# Patient Record
Sex: Female | Born: 1985
Health system: Southern US, Community
[De-identification: ages and names within clinical notes are randomized; demographics above are authoritative.]

## PROBLEM LIST (undated history)

## (undated) DIAGNOSIS — R569 Unspecified convulsions: Secondary | ICD-10-CM

## (undated) DIAGNOSIS — Z789 Other specified health status: Secondary | ICD-10-CM

## (undated) DIAGNOSIS — Z8619 Personal history of other infectious and parasitic diseases: Secondary | ICD-10-CM

## (undated) HISTORY — DX: Other specified health status: Z78.9

## (undated) HISTORY — DX: Unspecified convulsions: R56.9

## (undated) HISTORY — PX: TONSILLECTOMY: SUR1361

## (undated) HISTORY — DX: Personal history of other infectious and parasitic diseases: Z86.19

---

## 2019-12-04 ENCOUNTER — Encounter (HOSPITAL_COMMUNITY): Payer: Self-pay

## 2019-12-04 ENCOUNTER — Encounter (HOSPITAL_COMMUNITY): Payer: Self-pay | Admitting: Obstetrics and Gynecology

## 2019-12-04 ENCOUNTER — Inpatient Hospital Stay (HOSPITAL_COMMUNITY)
Admission: AD | Admit: 2019-12-04 | Discharge: 2019-12-04 | Disposition: A | Discharge status: Home / Self Care | Payer: Medicaid Other | Attending: Obstetrics and Gynecology | Admitting: Obstetrics and Gynecology

## 2019-12-04 ENCOUNTER — Inpatient Hospital Stay (HOSPITAL_BASED_OUTPATIENT_CLINIC_OR_DEPARTMENT_OTHER): Payer: Medicaid Other

## 2019-12-04 DIAGNOSIS — R109 Unspecified abdominal pain: Secondary | ICD-10-CM | POA: Diagnosis not present

## 2019-12-04 DIAGNOSIS — O0932 Supervision of pregnancy with insufficient antenatal care, second trimester: Secondary | ICD-10-CM

## 2019-12-04 DIAGNOSIS — O99332 Smoking (tobacco) complicating pregnancy, second trimester: Secondary | ICD-10-CM | POA: Insufficient documentation

## 2019-12-04 DIAGNOSIS — Z3A26 26 weeks gestation of pregnancy: Secondary | ICD-10-CM

## 2019-12-04 DIAGNOSIS — O4400 Placenta previa specified as without hemorrhage, unspecified trimester: Secondary | ICD-10-CM | POA: Insufficient documentation

## 2019-12-04 DIAGNOSIS — O26892 Other specified pregnancy related conditions, second trimester: Secondary | ICD-10-CM | POA: Diagnosis not present

## 2019-12-04 DIAGNOSIS — O4402 Placenta previa specified as without hemorrhage, second trimester: Secondary | ICD-10-CM

## 2019-12-04 DIAGNOSIS — F17210 Nicotine dependence, cigarettes, uncomplicated: Secondary | ICD-10-CM | POA: Insufficient documentation

## 2019-12-04 DIAGNOSIS — O26899 Other specified pregnancy related conditions, unspecified trimester: Secondary | ICD-10-CM

## 2019-12-04 DIAGNOSIS — O4440 Low lying placenta NOS or without hemorrhage, unspecified trimester: Secondary | ICD-10-CM | POA: Insufficient documentation

## 2019-12-04 DIAGNOSIS — R1030 Lower abdominal pain, unspecified: Secondary | ICD-10-CM | POA: Insufficient documentation

## 2019-12-04 DIAGNOSIS — O0930 Supervision of pregnancy with insufficient antenatal care, unspecified trimester: Secondary | ICD-10-CM

## 2019-12-04 LAB — CBC
HCT: 33.4 % — ABNORMAL LOW (ref 36.0–46.0)
Hemoglobin: 10.9 g/dL — ABNORMAL LOW (ref 12.0–15.0)
MCH: 26.9 pg (ref 26.0–34.0)
MCHC: 32.6 g/dL (ref 30.0–36.0)
MCV: 82.5 fL (ref 80.0–100.0)
Platelets: 253 10*3/uL (ref 150–400)
RBC: 4.05 MIL/uL (ref 3.87–5.11)
RDW: 14.8 % (ref 11.5–15.5)
WBC: 8.4 10*3/uL (ref 4.0–10.5)
nRBC: 0 % (ref 0.0–0.2)

## 2019-12-04 LAB — URINALYSIS, ROUTINE W REFLEX MICROSCOPIC
Bilirubin Urine: NEGATIVE
Glucose, UA: NEGATIVE mg/dL
Hgb urine dipstick: NEGATIVE
Ketones, ur: NEGATIVE mg/dL
Nitrite: NEGATIVE
Protein, ur: 30 mg/dL — AB
Specific Gravity, Urine: 1.026 (ref 1.005–1.030)
pH: 6 (ref 5.0–8.0)

## 2019-12-04 LAB — WET PREP, GENITAL
Sperm: NONE SEEN
Trich, Wet Prep: NONE SEEN
Yeast Wet Prep HPF POC: NONE SEEN

## 2019-12-04 NOTE — MAU Note (Signed)
.   Lynn Duncan is a 34 y.o. at [redacted]w[redacted]d here in MAU reporting: lower abdominal cramping since last night that has gotten worse throughout the day. PT states that she had one visit at 15 weeks in [place], and has been to the hospital for different complaints and was told at 23 weeks that she had a previa and needed to seek care. PT denies any VB or LOF. States she is homeless and is now living at the room at the end. LMP: 05/12/19 Onset of complaint: last night Pain score:8  Vitals:   12/04/19 1632  BP: 120/70  Pulse: 97  Resp: 18  Temp: 98.1 F (36.7 C)     FHT:142 Lab orders placed from triage: UA
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
## 2019-12-04 NOTE — Discharge Instructions (Signed)
Abdominal Pain During Pregnancy  Belly (abdominal) pain is common during pregnancy. There are many possible causes. Most of the time, it is not a serious problem. Other times, it can be a sign that something is wrong with the pregnancy. Always tell your doctor if you have belly pain. Follow these instructions at home:  Do not have sex or put anything in your vagina until your pain goes away completely.  Get plenty of rest until your pain gets better.  Drink enough fluid to keep your pee (urine) pale yellow.  Take over-the-counter and prescription medicines only as told by your doctor.  Keep all follow-up visits as told by your doctor. This is important. Contact a doctor if:  Your pain continues or gets worse after resting.  You have lower belly pain that: ? Comes and goes at regular times. ? Spreads to your back. ? Feels like menstrual cramps.  You have pain or burning when you pee (urinate). Get help right away if:  You have a fever or chills.  You have vaginal bleeding.  You are leaking fluid from your vagina.  You are passing tissue from your vagina.  You throw up (vomit) for more than 24 hours.  You have watery poop (diarrhea) for more than 24 hours.  Your baby is moving less than usual.  You feel very weak or faint.  You have shortness of breath.  You have very bad pain in your upper belly. Summary  Belly (abdominal) pain is common during pregnancy. There are many possible causes.  If you have belly pain during pregnancy, tell your doctor right away.  Keep all follow-up visits as told by your doctor. This is important. This information is not intended to replace advice given to you by your health care provider. Make sure you discuss any questions you have with your health care provider. Document Revised: 12/15/2018 Document Reviewed: 11/29/2016 Elsevier Patient Education  2020 [employer]  

## 2019-12-07 ENCOUNTER — Telehealth: Payer: Self-pay | Admitting: Family Medicine

## 2019-12-07 ENCOUNTER — Encounter (HOSPITAL_COMMUNITY): Payer: Self-pay | Admitting: Obstetrics and Gynecology

## 2019-12-07 LAB — GC/CHLAMYDIA PROBE AMP (~~LOC~~) NOT AT ARMC
Chlamydia: NEGATIVE
Comment: NEGATIVE
Comment: NORMAL
Neisseria Gonorrhea: NEGATIVE

## 2019-12-07 NOTE — Telephone Encounter (Signed)
Called the patient to inform of the upcoming visit. I was placed on hold for the patient. A woman then came back to the line and stated the patient will have to call our office on the house phone because the number we called is the business phone and she cant come to the phone right now. An appointment reminder will be mailed.

## 2020-01-04 ENCOUNTER — Ambulatory Visit (INDEPENDENT_AMBULATORY_CARE_PROVIDER_SITE_OTHER): Payer: Medicaid Other | Admitting: *Deleted

## 2020-01-04 ENCOUNTER — Other Ambulatory Visit: Payer: Self-pay

## 2020-01-04 DIAGNOSIS — O4400 Placenta previa specified as without hemorrhage, unspecified trimester: Secondary | ICD-10-CM

## 2020-01-04 DIAGNOSIS — O0990 Supervision of high risk pregnancy, unspecified, unspecified trimester: Secondary | ICD-10-CM

## 2020-01-04 NOTE — Progress Notes (Signed)
0823 I called home number on file for telephone visit today for new ob intake. Was told by a female who answered, she is no longer a resident there and she does not have a number to reach [place]. No mobile or work number available.  I called her contact number for Pamala Hurry and left a message I am calling for [facility] and you are listed as a contact. Please give her the message to call us regarding her appointment.  Craig Ionescu,RN

## 2020-01-06 ENCOUNTER — Encounter: Payer: Self-pay | Admitting: Family Medicine

## 2020-01-06 ENCOUNTER — Encounter: Payer: Medicaid Other | Admitting: Family Medicine

## 2020-01-06 NOTE — Progress Notes (Signed)
Patient did not keep appointment today. She will be called to reschedule.  

## 2020-01-14 ENCOUNTER — Ambulatory Visit (INDEPENDENT_AMBULATORY_CARE_PROVIDER_SITE_OTHER): Payer: Medicaid Other | Admitting: Obstetrics and Gynecology

## 2020-01-14 ENCOUNTER — Encounter: Payer: Self-pay | Admitting: Obstetrics and Gynecology

## 2020-01-14 ENCOUNTER — Other Ambulatory Visit: Payer: Self-pay

## 2020-01-14 VITALS — BP 114/76 | HR 85 | Wt 232.5 lb

## 2020-01-14 DIAGNOSIS — O0933 Supervision of pregnancy with insufficient antenatal care, third trimester: Secondary | ICD-10-CM

## 2020-01-14 DIAGNOSIS — O0990 Supervision of high risk pregnancy, unspecified, unspecified trimester: Secondary | ICD-10-CM

## 2020-01-14 DIAGNOSIS — O0930 Supervision of pregnancy with insufficient antenatal care, unspecified trimester: Secondary | ICD-10-CM | POA: Insufficient documentation

## 2020-01-14 DIAGNOSIS — Z3A32 32 weeks gestation of pregnancy: Secondary | ICD-10-CM

## 2020-01-14 DIAGNOSIS — O4403 Placenta previa specified as without hemorrhage, third trimester: Secondary | ICD-10-CM

## 2020-01-14 NOTE — Progress Notes (Signed)
New OB Note  01/14/2020   Clinic: [facility]  Chief Complaint: NOB  Transfer of Care Patient: no  History of Present Illness: Ms. Groen is a 34 y.o. G1P0 @ 32/5 weeks ([place] 6/26, based on Patient's last menstrual period was 05/30/2019.=27wk u/s.  Preg complicated by has Placenta previa found during pregnancy without hemorrhage; Supervision of high risk pregnancy, antepartum; and Late prenatal care on their problem list.   Patient on no medications, BC when she got pregnant No VB or spotting  ROS: A 12-point review of systems was performed and negative, except as stated in the above HPI.  OBGYN History: As per HPI. OB History  Gravida Para Term Preterm AB Living  1            SAB TAB Ectopic Multiple Live Births               # Outcome Date GA Lbr Len/2nd Weight Sex Delivery Anes PTL Lv  1 Current             Any issues with any prior pregnancies: not applicable Prior children are healthy, doing well, and without any problems or issues: not applicable History of pap smears: unknown.   Past Medical History: Past Medical History:  Diagnosis Date  . Medical history non-contributory     Past Surgical History: Past Surgical History:  Procedure Laterality Date  . TONSILLECTOMY      Family History:  History reviewed. No pertinent family history.  She denies any history of mental retardation, birth defects or genetic disorders in her or the FOB's history  Social History:  Social History   Socioeconomic History  . Marital status: Single    Spouse name: Not on file  . Number of children: Not on file  . Years of education: Not on file  . Highest education level: Not on file  Occupational History  . Not on file  Tobacco Use  . Smoking status: Light Tobacco Smoker  . Smokeless tobacco: Current User  . Tobacco comment: 3 cigs this month  Substance and Sexual Activity  . Alcohol use: Never  . Drug use: Never  . Sexual activity: Yes     Birth control/protection: None  Other Topics Concern  . Not on file  Social History Narrative  . Not on file   Social Determinants of Health   Financial Resource Strain:   . Difficulty of Paying Living Expenses:   Food Insecurity: No Food Insecurity  . Worried About Charity fundraiser in the Last Year: Never true  . Ran Out of Food in the Last Year: Never true  Transportation Needs: No Transportation Needs  . Lack of Transportation (Medical): No  . Lack of Transportation (Non-Medical): No  Physical Activity:   . Days of Exercise per Week:   . Minutes of Exercise per Session:   Stress:   . Feeling of Stress :   Social Connections:   . Frequency of Communication with Friends and Family:   . Frequency of Social Gatherings with Friends and Family:   . Attends Religious Services:   . Active Member of Clubs or Organizations:   . Attends Archivist Meetings:   Marland Kitchen Marital Status:   Intimate Partner Violence:   . Fear of Current or Ex-Partner:   . Emotionally Abused:   Marland Kitchen Physically Abused:   . Sexually Abused:      Allergy: No Known Allergies  Health Maintenance:  Mammogram Up to Date: not applicable  Current Outpatient Medications: PNV @CMEDTAKING @  Physical Exam:   BP 114/76   Pulse 85   Wt 232 lb 8 oz (105.5 kg)   LMP 05/30/2019   BMI 39.91 kg/m  Body mass index is 39.91 kg/m. Contractions: Not present Vag. Bleeding: None. Fundal height: 31 FHTs: 140s  General appearance: Well nourished, well developed female in no acute distress.  Neck:  Supple, normal appearance, and no thyromegaly  Cardiovascular: S1, S2 normal, no murmur, rub or gallop, regular rate and rhythm Respiratory:  Clear to auscultation bilateral. Normal respiratory effort Abdomen: positive bowel sounds and no masses, hernias; diffusely non tender to palpation, non distended Breasts: no breast s/s Neuro/Psych:  Normal mood and affect.  Skin:  Warm and dry.    Laboratory: reviewed  Imaging:  reviewed  Assessment: pt stable  Plan: 1. Supervision of high risk pregnancy, antepartum Routine care.  - Culture, OB Urine - Obstetric Panel, Including HIV - Glucose Tolerance, 2 Hours w/1 Hour - Hepatitis C Antibody - CMP and Liver - Protein / creatinine ratio, urine - [id number]+Oxyco+Alc+Crt-Bund - Hemoglobin A1c - TSH - [place] MFM OB DETAIL +14 WK; Future - [place] MFM OB Transvaginal; Future  2. Late prenatal care  3. Previa Precautions given. Rpt u/s ordered.   Problem list reviewed and updated.  Follow up in 2 weeks.   >50% of 25 min visit spent on counseling and coordination of care.     Durene Romans MD Attending Center for [facility])

## 2020-01-15 ENCOUNTER — Inpatient Hospital Stay (HOSPITAL_COMMUNITY)
Admission: AD | Admit: 2020-01-15 | Discharge: 2020-01-15 | Disposition: A | Discharge status: Home / Self Care | Payer: Medicaid Other | Attending: Obstetrics and Gynecology | Admitting: Obstetrics and Gynecology

## 2020-01-15 ENCOUNTER — Telehealth: Payer: Self-pay | Admitting: Family Medicine

## 2020-01-15 ENCOUNTER — Inpatient Hospital Stay (HOSPITAL_BASED_OUTPATIENT_CLINIC_OR_DEPARTMENT_OTHER): Payer: Medicaid Other

## 2020-01-15 ENCOUNTER — Other Ambulatory Visit: Payer: Self-pay

## 2020-01-15 ENCOUNTER — Encounter (HOSPITAL_COMMUNITY): Payer: Self-pay | Admitting: Obstetrics and Gynecology

## 2020-01-15 DIAGNOSIS — O26893 Other specified pregnancy related conditions, third trimester: Secondary | ICD-10-CM | POA: Diagnosis present

## 2020-01-15 DIAGNOSIS — O368330 Maternal care for abnormalities of the fetal heart rate or rhythm, third trimester, not applicable or unspecified: Secondary | ICD-10-CM

## 2020-01-15 DIAGNOSIS — O288 Other abnormal findings on antenatal screening of mother: Secondary | ICD-10-CM

## 2020-01-15 DIAGNOSIS — O98313 Other infections with a predominantly sexual mode of transmission complicating pregnancy, third trimester: Secondary | ICD-10-CM | POA: Diagnosis not present

## 2020-01-15 DIAGNOSIS — F17210 Nicotine dependence, cigarettes, uncomplicated: Secondary | ICD-10-CM | POA: Diagnosis not present

## 2020-01-15 DIAGNOSIS — O99323 Drug use complicating pregnancy, third trimester: Secondary | ICD-10-CM | POA: Diagnosis not present

## 2020-01-15 DIAGNOSIS — R102 Pelvic and perineal pain: Secondary | ICD-10-CM | POA: Insufficient documentation

## 2020-01-15 DIAGNOSIS — Z3A32 32 weeks gestation of pregnancy: Secondary | ICD-10-CM | POA: Diagnosis not present

## 2020-01-15 DIAGNOSIS — O26899 Other specified pregnancy related conditions, unspecified trimester: Secondary | ICD-10-CM

## 2020-01-15 DIAGNOSIS — K529 Noninfective gastroenteritis and colitis, unspecified: Secondary | ICD-10-CM | POA: Diagnosis not present

## 2020-01-15 DIAGNOSIS — O218 Other vomiting complicating pregnancy: Secondary | ICD-10-CM | POA: Diagnosis not present

## 2020-01-15 DIAGNOSIS — O99333 Smoking (tobacco) complicating pregnancy, third trimester: Secondary | ICD-10-CM | POA: Insufficient documentation

## 2020-01-15 DIAGNOSIS — O99613 Diseases of the digestive system complicating pregnancy, third trimester: Secondary | ICD-10-CM | POA: Diagnosis not present

## 2020-01-15 DIAGNOSIS — O4443 Low lying placenta NOS or without hemorrhage, third trimester: Secondary | ICD-10-CM

## 2020-01-15 DIAGNOSIS — R109 Unspecified abdominal pain: Secondary | ICD-10-CM

## 2020-01-15 DIAGNOSIS — O0990 Supervision of high risk pregnancy, unspecified, unspecified trimester: Secondary | ICD-10-CM

## 2020-01-15 DIAGNOSIS — R768 Other specified abnormal immunological findings in serum: Secondary | ICD-10-CM

## 2020-01-15 DIAGNOSIS — A599 Trichomoniasis, unspecified: Secondary | ICD-10-CM

## 2020-01-15 DIAGNOSIS — F1290 Cannabis use, unspecified, uncomplicated: Secondary | ICD-10-CM | POA: Insufficient documentation

## 2020-01-15 DIAGNOSIS — O0933 Supervision of pregnancy with insufficient antenatal care, third trimester: Secondary | ICD-10-CM

## 2020-01-15 DIAGNOSIS — O4440 Low lying placenta NOS or without hemorrhage, unspecified trimester: Secondary | ICD-10-CM

## 2020-01-15 LAB — RAPID URINE DRUG SCREEN, HOSP PERFORMED
Amphetamines: NOT DETECTED
Barbiturates: NOT DETECTED
Benzodiazepines: NOT DETECTED
Cocaine: NOT DETECTED
Opiates: NOT DETECTED
Tetrahydrocannabinol: POSITIVE — AB

## 2020-01-15 LAB — URINALYSIS, ROUTINE W REFLEX MICROSCOPIC
Bilirubin Urine: NEGATIVE
Glucose, UA: NEGATIVE mg/dL
Hgb urine dipstick: NEGATIVE
Ketones, ur: NEGATIVE mg/dL
Nitrite: NEGATIVE
Protein, ur: NEGATIVE mg/dL
Specific Gravity, Urine: 1.026 (ref 1.005–1.030)
pH: 6 (ref 5.0–8.0)

## 2020-01-15 LAB — PROTEIN / CREATININE RATIO, URINE
Creatinine, Urine: 171.5 mg/dL
Protein, Ur: 19 mg/dL
Protein/Creat Ratio: 111 mg/g creat (ref 0–200)

## 2020-01-15 LAB — WET PREP, GENITAL
Clue Cells Wet Prep HPF POC: NONE SEEN
Sperm: NONE SEEN
Yeast Wet Prep HPF POC: NONE SEEN

## 2020-01-15 LAB — TSH: TSH: 1.4 u[IU]/mL (ref 0.450–4.500)

## 2020-01-15 LAB — HEPATITIS C ANTIBODY: Hep C Virus Ab: >11 s/co ratio — ABNORMAL HIGH (ref 0.0–0.9)

## 2020-01-15 MED ORDER — FAMOTIDINE IN NACL 20-0.9 MG/50ML-% IV SOLN
20.0000 mg | Freq: Once | INTRAVENOUS | Status: AC
  Administered 2020-01-15: 20 mg via INTRAVENOUS
  Filled 2020-01-15: qty 50

## 2020-01-15 MED ORDER — LACTATED RINGERS IV BOLUS
1000.0000 mL | Freq: Once | INTRAVENOUS | Status: AC
  Administered 2020-01-15: 1000 mL via INTRAVENOUS

## 2020-01-15 MED ORDER — METRONIDAZOLE 500 MG PO TABS
2000.0000 mg | ORAL_TABLET | Freq: Once | ORAL | Status: AC
  Administered 2020-01-15: 2000 mg via ORAL
  Filled 2020-01-15: qty 4

## 2020-01-15 MED ORDER — SODIUM CHLORIDE 0.9 % IV SOLN
8.0000 mg | Freq: Once | INTRAVENOUS | Status: AC
  Administered 2020-01-15: 8 mg via INTRAVENOUS
  Filled 2020-01-15: qty 4

## 2020-01-15 NOTE — MAU Provider Note (Signed)
History     CSN: [id number]  Arrival date and time: 01/15/20 1101   First Provider Initiated Contact with Patient 01/15/20 1125      Chief Complaint  Patient presents with  . Rupture of Membranes   [name] is a 34 y.o. G1P0 at [redacted]w[redacted]d who presents to MAU for possible ROM around 4AM this morning. Patient reports she did not come in sooner because she "thought it was normal." Patient reports single gush of clear fluid during urination that was clear and odorless and ended up in the toilet. Patient denies any additional leaking of fluid since that time. Patient came by EMS and reports she decided to call the ambulance because of a "heavy pressure/stretching and like her vaginal area was being ripped apart" around 830AM this morning. Patient reports this same sensation came about 5 minutes later and also had it in the ambulance as well, but has not had it since the ambulance ride and reports the event in the ambulance ride was mild. Patient endorses only 3 episodes of this pain since this morning and denies this pain while in MAU.  Patient also reports nausea and vomiting that started around 830AM along with diarrhea. Pt denies sick contacts. Patient reports two episodes of diarrhea since that time, but endorses small pieces of urine present. Patient reports last BM 30 min prior to ambulance arrival. Patient reports she tried to drink ginger ale and had crackers but those did not stay down. Patient reports vomiting x2 since 0830AM. Patient denies issues with nausea and vomiting in the past. Patient endorses eating chicken alfredo that she made at home last night.  Pt denies VB, ctx, decreased FM, vaginal discharge/odor/itching. Pt denies constipation, or urinary problems. Pt denies fever, chills, fatigue, sweating or changes in appetite. Pt denies SOB or chest pain. Pt denies dizziness, HA, light-headedness, weakness.  Problems this pregnancy include: previa, complete. Allergies?  NKDA Current medications/supplements? PNVs Prenatal care provider? [facility], next appt 01/28/2020   OB History    Gravida  1   Para      Term      Preterm      AB      Living        SAB      TAB      Ectopic      Multiple      Live Births              Past Medical History:  Diagnosis Date  . Medical history non-contributory     Past Surgical History:  Procedure Laterality Date  . TONSILLECTOMY      History reviewed. No pertinent family history.  Social History   Tobacco Use  . Smoking status: Light Tobacco Smoker  . Smokeless tobacco: Current User  . Tobacco comment: 3 cigs this month  Substance Use Topics  . Alcohol use: Never  . Drug use: Never    Allergies: No Known Allergies  Medications Prior to Admission  Medication Sig Dispense Refill Last Dose  . acetaminophen (TYLENOL) 325 MG tablet Take 650 mg by mouth every 6 (six) hours as needed for mild pain or headache.   Past Week at Unknown time    Review of Systems  Constitutional: Negative for chills, diaphoresis, fatigue and fever.  Eyes: Negative for visual disturbance.  Respiratory: Negative for shortness of breath.   Cardiovascular: Negative for chest pain.  Gastrointestinal: Positive for abdominal pain, diarrhea, nausea and vomiting. Negative for constipation.  Genitourinary: Positive  for pelvic pain. Negative for dysuria, flank pain, frequency, urgency, vaginal bleeding and vaginal discharge.  Neurological: Negative for dizziness, weakness, light-headedness and headaches.   Physical Exam   Blood pressure (!) 96/43, pulse 69, temperature 97.9 F (36.6 C), temperature source Oral, resp. rate 16, last menstrual period 05/30/2019, SpO2 99 %, unknown if currently breastfeeding.  Patient Vitals for the past 24 hrs:  BP Temp Temp src Pulse Resp SpO2  01/15/20 1529 (!) 96/43 -- -- 69 -- --  01/15/20 1140 -- -- -- -- -- 99 %  01/15/20 1117 98/63 -- -- 92 -- --  01/15/20 1111 -- 97.9 F  (36.6 C) Oral -- 16 --   Physical Exam  Constitutional: She is oriented to person, place, and time. She appears well-developed and well-nourished. No distress.  HENT:  Head: Normocephalic and atraumatic.  Respiratory: Effort normal.  GI: Soft. She exhibits no distension and no mass. There is no abdominal tenderness. There is no rebound and no guarding.  Neurological: She is alert and oriented to person, place, and time.  Skin: Skin is warm and dry. She is not diaphoretic.  Psychiatric: She has a normal mood and affect. Her behavior is normal. Judgment and thought content normal.   Results for orders placed or performed during the hospital encounter of 01/15/20 (from the past 24 hour(s))  Urinalysis, Routine w reflex microscopic     Status: Abnormal   Collection Time: 01/15/20 11:27 AM  Result Value Ref Range   Color, Urine YELLOW YELLOW   APPearance HAZY (A) CLEAR   Specific Gravity, Urine 1.026 1.005 - 1.030   pH 6.0 5.0 - 8.0   Glucose, UA NEGATIVE NEGATIVE mg/dL   Hgb urine dipstick NEGATIVE NEGATIVE   Bilirubin Urine NEGATIVE NEGATIVE   Ketones, ur NEGATIVE NEGATIVE mg/dL   Protein, ur NEGATIVE NEGATIVE mg/dL   Nitrite NEGATIVE NEGATIVE   Leukocytes,Ua MODERATE (A) NEGATIVE   RBC / HPF 6-10 0 - 5 RBC/hpf   WBC, UA 6-10 0 - 5 WBC/hpf   Bacteria, UA FEW (A) NONE SEEN   Squamous Epithelial / LPF 6-10 0 - 5   Mucus PRESENT    Trichomonas, UA PRESENT (A) NONE SEEN  Urine rapid drug screen (hosp performed)     Status: Abnormal   Collection Time: 01/15/20 11:36 AM  Result Value Ref Range   Opiates NONE DETECTED NONE DETECTED   Cocaine NONE DETECTED NONE DETECTED   Benzodiazepines NONE DETECTED NONE DETECTED   Amphetamines NONE DETECTED NONE DETECTED   Tetrahydrocannabinol POSITIVE (A) NONE DETECTED   Barbiturates NONE DETECTED NONE DETECTED  Wet prep, genital     Status: Abnormal   Collection Time: 01/15/20  3:18 PM  Result Value Ref Range   Yeast Wet Prep HPF POC NONE SEEN  NONE SEEN   Trich, Wet Prep PRESENT (A) NONE SEEN   Clue Cells Wet Prep HPF POC NONE SEEN NONE SEEN   WBC, Wet Prep HPF POC MANY (A) NONE SEEN   Sperm NONE SEEN    No results found.  MAU Course  Procedures  MDM -N/V/diarrhea since this morning, without continued episodes in MAU -UA: hazy/mod leuks/few bacteria/+trich, sending urine for culture -UDS: +THC -1L LR + 20mg  Pepcid + 8mg  Zofran given, pt reports N/V now resolved -pt able to urinate after fluids -PO challenge successful  -no concern for ROM with single episode of fluid simultaneously with urination without additional episode of leakage and normal fluid on [place] -no abdominal or pelvic pain present  while in MAU for the entire duration of visit -+HCV antibodies, blood drawn for confirmatory testing while in MAU, pt reports she has been told she was positive for antibodies in the past, but that it was "nothing to worry about" -aptima not performed in office yesterday at [facility], performed WetPrep/GC/CT today in light of +trich in urine -WetPrep: +trich -metronidazole 2g given in MAU  -EFM: non-reactive       -baseline: 140/130       -variability: moderate       -accels: present, 10x10       -decels: absent       -TOCO: few isolated ctx, pt not feeling, not present on majority of strip, cervical exam not performed -US: previa resolved, now low lying, AFI 19.6cm, CL 4.2cm, ~9cm fibroid -BPP: 8/8 -consulted with Dr. Rip Harbour in light of non-reactive NST, per Dr. Rip Harbour, pt OK to be discharged home with non-reactive NST and BPP 8/8 and to follow previously establish office/US appt schedule -pt discharged to home in stable condition  Orders Placed This Encounter  Procedures  . Culture, OB Urine    Standing Status:   Standing    Number of Occurrences:   1  . Wet prep, genital    Standing Status:   Standing    Number of Occurrences:   1  . [place] MFM FETAL BPP WO NON STRESS    Standing Status:   Standing    Number of Occurrences:   1     Order Specific Question:   Symptom/Reason for Exam    Answer:   Non-reactive NST (non-stress test) [id number]  . [place] MFM OB Transvaginal    Patient has known previa.    Standing Status:   Standing    Number of Occurrences:   1    Order Specific Question:   Symptom/Reason for Exam    Answer:   Abdominal pain in pregnancy [id number]  . Urinalysis, Routine w reflex microscopic    Standing Status:   Standing    Number of Occurrences:   1  . Urine rapid drug screen (hosp performed)    Standing Status:   Standing    Number of Occurrences:   1  . Hepatitis c vrs RNA detect by PCR-qual    Standing Status:   Standing    Number of Occurrences:   1  . Insert peripheral IV    Standing Status:   Standing    Number of Occurrences:   1  . Discharge patient    Order Specific Question:   Discharge disposition    Answer:   01-Home or Self Care [1]    Order Specific Question:   Discharge patient date    Answer:   01/15/2020   Orders Placed This Encounter  Procedures  . Culture, OB Urine    Standing Status:   Standing    Number of Occurrences:   1  . Wet prep, genital    Standing Status:   Standing    Number of Occurrences:   1  . [place] MFM FETAL BPP WO NON STRESS    Standing Status:   Standing    Number of Occurrences:   1    Order Specific Question:   Symptom/Reason for Exam    Answer:   Non-reactive NST (non-stress test) [id number]  . [place] MFM OB Transvaginal    Patient has known previa.    Standing Status:   Standing    Number of Occurrences:   1  Order Specific Question:   Symptom/Reason for Exam    Answer:   Abdominal pain in pregnancy [id number]  . Urinalysis, Routine w reflex microscopic    Standing Status:   Standing    Number of Occurrences:   1  . Urine rapid drug screen (hosp performed)    Standing Status:   Standing    Number of Occurrences:   1  . Hepatitis c vrs RNA detect by PCR-qual    Standing Status:   Standing    Number of Occurrences:   1  . Insert peripheral IV     Standing Status:   Standing    Number of Occurrences:   1  . Discharge patient    Order Specific Question:   Discharge disposition    Answer:   01-Home or Self Care [1]    Order Specific Question:   Discharge patient date    Answer:   01/15/2020    Assessment and Plan   1. Gastroenteritis   2. Supervision of high risk pregnancy, antepartum   3. Abdominal pain in pregnancy   4. Non-reactive NST (non-stress test)   5. Trichomoniasis   6. Low-lying placenta   7. Hepatitis C antibody test positive   8. Abdominal pain during pregnancy in third trimester   9. Pelvic pain in pregnancy   10. Marijuana use    Allergies as of 01/15/2020   No Known Allergies     Medication List    TAKE these medications   acetaminophen 325 MG tablet Commonly known as: TYLENOL Take 650 mg by mouth every 6 (six) hours as needed for mild pain or headache.       -will call with culture results, if positive -RX expedited partner therapy for trich -EPT info given -low-lying placenta resolved -pt advised not to restart smoking marijuana for N/V or other reason as this can worsen symptoms of N/V -discussed s/sx of PTL/ROM -return MAU precautions given -pt discharged to home in stable condition   Elmyra Ricks E Raymonda Pell 01/15/2020, 3:43 PM

## 2020-01-15 NOTE — MAU Note (Signed)
.  Lynn Duncan is a 34 y.o. at [redacted]w[redacted]d here in MAU reporting: Patient states that she had a big gush of clear odorless fluid at 0400 this morning. She states that the fluid is no longer coming out. She also states that she has pelvic pressure. Yesterday at her first prenatal visit she was dx with a complete placenta previa. No VB. Endorses good fetal movement. Ctx are x10 minutes apart.  Pain score: 8  FHT:[id number]

## 2020-01-15 NOTE — Discharge Instructions (Signed)
Abdominal Pain During Pregnancy  Abdominal pain is common during pregnancy, and has many possible causes. Some causes are more serious than others, and sometimes the cause is not known. Abdominal pain can be a sign that labor is starting. It can also be caused by normal growth and stretching of muscles and ligaments during pregnancy. Always tell your health care provider if you have any abdominal pain. Follow these instructions at home:  Do not have sex or put anything in your vagina until your pain goes away completely.  Get plenty of rest until your pain improves.  Drink enough fluid to keep your urine pale yellow.  Take over-the-counter and prescription medicines only as told by your health care provider.  Keep all follow-up visits as told by your health care provider. This is important. Contact a health care provider if:  Your pain continues or gets worse after resting.  You have lower abdominal pain that: ? Comes and goes at regular intervals. ? Spreads to your back. ? Is similar to menstrual cramps.  You have pain or burning when you urinate. Get help right away if:  You have a fever or chills.  You have vaginal bleeding.  You are leaking fluid from your vagina.  You are passing tissue from your vagina.  You have vomiting or diarrhea that lasts for more than 24 hours.  Your baby is moving less than usual.  You feel very weak or faint.  You have shortness of breath.  You develop severe pain in your upper abdomen. Summary  Abdominal pain is common during pregnancy, and has many possible causes.  If you experience abdominal pain during pregnancy, tell your health care provider right away.  Follow your health care provider's home care instructions and keep all follow-up visits as directed. This information is not intended to replace advice given to you by your health care provider. Make sure you discuss any questions you have with your health care  provider. Document Revised: 12/15/2018 Document Reviewed: 11/29/2016 Elsevier Patient Education  2020 [place].      Fetal Movement Counts Patient Name: ________________________________________________ Patient Due Date: ____________________ What is a fetal movement count?  A fetal movement count is the number of times that you feel your baby move during a certain amount of time. This may also be called a fetal kick count. A fetal movement count is recommended for every pregnant woman. You may be asked to start counting fetal movements as early as week 28 of your pregnancy. Pay attention to when your baby is most active. You may notice your baby's sleep and wake cycles. You may also notice things that make your baby move more. You should do a fetal movement count:  When your baby is normally most active.  At the same time each day. A good time to count movements is while you are resting, after having something to eat and drink. How do I count fetal movements? 1. Find a quiet, comfortable area. Sit, or lie down on your side. 2. Write down the date, the start time and stop time, and the number of movements that you felt between those two times. Take this information with you to your health care visits. 3. Write down your start time when you feel the first movement. 4. Count kicks, flutters, swishes, rolls, and jabs. You should feel at least 10 movements. 5. You may stop counting after you have felt 10 movements, or if you have been counting for 2 hours. Write down the  stop time. 6. If you do not feel 10 movements in 2 hours, contact your health care provider for further instructions. Your health care provider may want to do additional tests to assess your baby's well-being. Contact a health care provider if:  You feel fewer than 10 movements in 2 hours.  Your baby is not moving like he or she usually does. Date: ____________ Start time: ____________ Stop time: ____________ Movements:  ____________ Date: ____________ Start time: ____________ Stop time: ____________ Movements: ____________ Date: ____________ Start time: ____________ Stop time: ____________ Movements: ____________ Date: ____________ Start time: ____________ Stop time: ____________ Movements: ____________ Date: ____________ Start time: ____________ Stop time: ____________ Movements: ____________ Date: ____________ Start time: ____________ Stop time: ____________ Movements: ____________ Date: ____________ Start time: ____________ Stop time: ____________ Movements: ____________ Date: ____________ Start time: ____________ Stop time: ____________ Movements: ____________ Date: ____________ Start time: ____________ Stop time: ____________ Movements: ____________ This information is not intended to replace advice given to you by your health care provider. Make sure you discuss any questions you have with your health care provider. Document Revised: 04/16/2019 Document Reviewed: 04/16/2019 Elsevier Patient Education  [place].        Trichomoniasis Trichomoniasis is an STI (sexually transmitted infection) that can affect both women and men. In women, the outer area of the female genitalia (vulva) and the vagina are affected. In men, mainly the penis is affected, but the prostate and other reproductive organs can also be involved.  This condition can be treated with medicine. It often has no symptoms (is asymptomatic), especially in men. If not treated, trichomoniasis can last for months or years. What are the causes? This condition is caused by a parasite called Trichomonas vaginalis. Trichomoniasis most often spreads from person to person (is contagious) through sexual contact. What increases the risk? The following factors may make you more likely to develop this condition:  Having unprotected sex.  Having sex with a partner who has trichomoniasis.  Having multiple sexual partners.  Having had  previous trichomoniasis infections or other STIs. What are the signs or symptoms? In women, symptoms of trichomoniasis include:  Abnormal vaginal discharge that is clear, white, gray, or yellow-green and foamy and has an unusual "fishy" odor.  Itching and irritation of the vagina and vulva.  Burning or pain during urination or sex.  Redness and swelling of the genitals. In men, symptoms of trichomoniasis include:  Penile discharge that may be foamy or contain pus.  Pain in the penis. This may happen only when urinating.  Itching or irritation inside the penis.  Burning after urination or ejaculation. How is this diagnosed? In women, this condition may be found during a routine Pap test or physical exam. It may be found in men during a routine physical exam. Your health care provider may do tests to help diagnose this infection, such as:  Urine tests (men and women).  The following in women: ? Testing the pH of the vagina. ? A vaginal swab test that checks for the Trichomonas vaginalis parasite. ? Testing vaginal secretions. Your health care provider may test you for other STIs, including HIV (human immunodeficiency virus). How is this treated? This condition is treated with medicine taken by mouth (orally), such as metronidazole or tinidazole, to fight the infection. Your sexual partner(s) also need to be tested and treated.  If you are a woman and you plan to become pregnant or think you may be pregnant, tell your health care provider right away. Some medicines that  are used to treat the infection should not be taken during pregnancy. Your health care provider may recommend over-the-counter medicines or creams to help relieve itching or irritation. You may be tested for infection again 3 months after treatment. Follow these instructions at home:  Take and use over-the-counter and prescription medicines, including creams, only as told by your health care provider.  Take your  antibiotic medicine as told by your health care provider. Do not stop taking the antibiotic even if you start to feel better.  Do not have sex until 7-10 days after you finish your medicine, or until your health care provider approves. Ask your health care provider when you may start to have sex again.  (Women) Do not douche or wear tampons while you have the infection.  Discuss your infection with your sexual partner(s). Make sure that your partner gets tested and treated, if necessary.  Keep all follow-up visits as told by your health care provider. This is important. How is this prevented?   Use condoms every time you have sex. Using condoms correctly and consistently can help protect against STIs.  Avoid having multiple sexual partners.  Talk with your sexual partner about any symptoms that either of you may have, as well as any history of STIs.  Get tested for STIs and STDs (sexually transmitted diseases) before you have sex. Ask your partner to do the same.  Do not have sexual contact if you have symptoms of trichomoniasis or another STI. Contact a health care provider if:  You still have symptoms after you finish your medicine.  You develop pain in your abdomen.  You have pain when you urinate.  You have bleeding after sex.  You develop a rash.  You feel nauseous or you vomit.  You plan to become pregnant or think you may be pregnant. Summary  Trichomoniasis is an STI (sexually transmitted infection) that can affect both women and men.  This condition often has no symptoms (is asymptomatic), especially in men.  Without treatment, this condition can last for months or years.  You should not have sex until 7-10 days after you finish your medicine, or until your health care provider approves. Ask your health care provider when you may start to have sex again.  Discuss your infection with your sexual partner(s). Make sure that your partner gets tested and treated, if  necessary. This information is not intended to replace advice given to you by your health care provider. Make sure you discuss any questions you have with your health care provider. Document Revised: 06/10/2018 Document Reviewed: 06/10/2018 Elsevier Patient Education  2020 [employer].       Expedited Partner Therapy:  Information Sheet for Patients and Partners               You have been offered expedited partner therapy (EPT). This information sheet contains important information and warnings you need to be aware of, so please read it carefully.   Expedited Partner Therapy (EPT) is the clinical practice of treating the sexual partners of persons who receive chlamydia, gonorrhea, or trichomoniasis diagnoses by providing medications or prescriptions to the patient. Patients then provide partners with these therapies without the health-care provider having examined the partner. In other words, EPT is a convenient, fast and private way for patients to help their sexual partners get treated.   Chlamydia and gonorrhea are bacterial infections you get from having sex with a person who is already infected. Trichomoniasis (or trich) is a very  common sexually transmitted infection (STI) that is caused by infection with a protozoan parasite called Trichomonas vaginalis.  Many people with these infections dont know it because they feel fine, but without treatment these infections can cause serious health problems, such as pelvic inflammatory disease, ectopic pregnancy, infertility and increased risk of HIV.   It is important to get treated as soon as possible to protect your health, to avoid spreading these infections to others, and to prevent yourself from becoming re-infected. The good news is these infections can be easily cured with proper antibiotic medicine. The best way to take care of your self is to see a doctor or go to your local health department. If you are not able to see a doctor or  other medical provider, you should take EPT.    Recommended Medication: EPT for Chlamydia:  Azithromycin (Zithromax) 1 gram orally in a single dose EPT for Gonorrhea:  Cefixime (Suprax) 400 milligrams orally in a single dose PLUS azithromycin (Zithromax) 1 gram orally in a single dose EPT for Trichomoniasis:  Metronidazole (Flagyl) 2 grams orally in a single dose   These medicines are very safe. However, you should not take them if you have ever had an allergic reaction (like a rash) to any of these medicines: azithromycin (Zithromax), erythromycin, clarithromycin (Biaxin), metronidazole (Flagyl), tinidazole (Tindimax). If you are uncertain about whether you have an allergy, call your medical provider or pharmacist before taking this medicine. If you have a serious, long-term illness like kidney, liver or heart disease, colitis or stomach problems, or you are currently taking other prescription medication, talk to your provider before taking this medication.   Women: If you have lower belly pain, pain during sex, vomiting, or a fever, do not take this medicine. Instead, you should see a medical provider to be certain you do not have pelvic inflammatory disease (PID). PID can be serious and lead to infertility, pregnancy problems or chronic pelvic pain.   Pregnant Women: It is very important for you to see a doctor to get pregnancy services and pre-natal care. These antibiotics for EPT are safe for pregnant women, but you still need to see a medical provider as soon as possible. It is also important to note that Doxycycline is an alternative therapy for chlamydia, but it should not be taken by someone who is pregnant.   Men: If you have pain or swelling in the testicles or a fever, do not take this medicine and see a medical provider.     Men who have sex with men (MSM): MSM in [place] continue to experience high rates of syphilis and HIV. Many MSM with gonorrhea or chlamydia could also have  syphilis and/or HIV and not know it. If you are a man who has sex with other men, it is very important that you see a medical provider and are tested for HIV and syphilis. EPT is not recommended for gonorrhea for MSM.  Recommended treatment for gonorrhea for MSM is Rocephin (shot) AND azithromycin due to decreased cure rate.  Please see your medical provider if this is the case.    Along with this information sheet is a prescription for the medicine. If you receive a prescription it will be in your name and will indicate your date of birth, or it will be in the name of Expedited Partner Therapy.   In either case, you can have the prescription filled at a pharmacy. You will be responsible for the cost of the medicine, unless  you have prescription drug coverage. In that case, you could provide your name so the pharmacy could bill your health plan.   Take the medication as directed. Some people will have a mild, upset stomach, which does not last long. AVOID alcohol 24 hours after taking metronidazole (Flagyl) to reduce the possibility of a disulfiram-like reaction (severe vomiting and abdominal pain).  After taking the medicine, do not have sex for 7 days. Do not share this medicine or give it to anyone else. It is important to tell everyone you have had sex with in the last 60 days that they need to go and get tested for sexually transmitted infections.   Ways to prevent these and other sexually transmitted infections (STIs):    Abstain from sex. This is the only sure way to avoid getting an STI.   Use barrier methods, such as condoms, consistently and correctly.   Limit the number of sexual partners.   Have regular physical exams, including testing for STIs.   For more information about EPT or other issues pertaining to an STI, please contact your medical provider or the [facility] Department at [phone] or  [email or web address]           Hepatitis C During Pregnancy  Hepatitis C is a viral disease in which the liver becomes inflamed. This disease spreads through contaminated blood. Hepatitis C can cause serious liver damage. It can cause the liver to become scarred (cirrhosis) or lead to liver cancer. You may not have symptoms immediately after infection, and it can take years for symptoms to appear. Will the disease spread to my baby? There are no vaccines or treatments available to lower the risk of spreading hepatitis C to your unborn baby (fetus). However, the chances of your unborn baby being infected are low. The risk of infection to your fetus increases if:  During labor, you have prolonged water breaking (membrane rupture) that lasts 6 hours or longer.  You have a weakened immune system due to HIV infection.  You have a high hepatitis C virus (HCV) count in your blood.  You have high nucleic acid (RNA) levels in your blood (you are RNA-positive).  An internal fetal monitor is used during labor.  You have abnormal liver function test results.  You have scarring of the liver (cirrhosis). The risk of infection to your baby does not increase if:  Your baby is born by vaginal delivery or cesarean delivery.  You breastfeed your baby. If you do not have HIV, you can breastfeed your baby even if you have hepatitis C. Follow these instructions at home: Medicines  Take over-the-counter and prescription medicines only as told by your health care provider.  Make sure you understand what medicines are safe for you to take during pregnancy.  Check with your health care provider before taking any new medicines, including over-the-counter medicines. You may need a different dosage or you may need to avoid taking certain medicines if liver damage is possible.  Take your vitamins and supplements as directed by your health  care provider. General instructions  Get plenty of rest and sleep.  Eat a healthy and balanced diet.  Protect your abdomen from injury (trauma). Trauma could rupture a swollen, enlarged liver.  Keep all follow-up visits as told by your health care provider. This is important so your liver function can be checked. Contact a health care provider if:  You develop nausea, tiredness, and loss of appetite.  Your urine is dark  and your stool is light colored or gray.  You develop pain in your upper abdomen. Get help right away if:  Your skin and the whites of your eyes turn yellow.  You have a fever or persistent symptoms for more than 2-3 days.  You have a fever and your symptoms suddenly get worse.  You develop abdominal pain.  You develop bruising or bleeding problems.  You develop a severe headache. Summary  Hepatitis C is a viral disease in which the liver becomes inflamed.  This condition spreads through contaminated blood.  The chances of your unborn baby being infected are low, but they increase if you have HIV/AIDS, or if you have a prolonged water breaking during labor.  You can safely breastfeed your baby if you have hepatitis C as long as you do not also have HIV/AIDS. This information is not intended to replace advice given to you by your health care provider. Make sure you discuss any questions you have with your health care provider. Document Revised: 08/09/2017 Document Reviewed: 12/05/2016 Elsevier Patient Education  [place].       Preterm Labor and Birth Information  The normal length of a pregnancy is 39-41 weeks. Preterm labor is when labor starts before 37 completed weeks of pregnancy. What are the risk factors for preterm labor? Preterm labor is more likely to occur in women who:  Have certain infections during pregnancy such as a bladder infection, sexually transmitted infection, or infection inside the uterus  (chorioamnionitis).  Have a shorter-than-normal cervix.  Have gone into preterm labor before.  Have had surgery on their cervix.  Are younger than age 14 or older than age 61.  Are African American.  Are pregnant with twins or multiple babies (multiple gestation).  Take street drugs or smoke while pregnant.  Do not gain enough weight while pregnant.  Became pregnant shortly after having been pregnant. What are the symptoms of preterm labor? Symptoms of preterm labor include:  Cramps similar to those that can happen during a menstrual period. The cramps may happen with diarrhea.  Pain in the abdomen or lower back.  Regular uterine contractions that may feel like tightening of the abdomen.  A feeling of increased pressure in the pelvis.  Increased watery or bloody mucus discharge from the vagina.  Water breaking (ruptured amniotic sac). Why is it important to recognize signs of preterm labor? It is important to recognize signs of preterm labor because babies who are born prematurely may not be fully developed. This can put them at an increased risk for:  Long-term (chronic) heart and lung problems.  Difficulty immediately after birth with regulating body systems, including blood sugar, body temperature, heart rate, and breathing rate.  Bleeding in the brain.  Cerebral palsy.  Learning difficulties.  Death. These risks are highest for babies who are born before 18 weeks of pregnancy. How is preterm labor treated? Treatment depends on the length of your pregnancy, your condition, and the health of your baby. It may involve:  Having a stitch (suture) placed in your cervix to prevent your cervix from opening too early (cerclage).  Taking or being given medicines, such as: ? Hormone medicines. These may be given early in pregnancy to help support the pregnancy. ? Medicine to stop contractions. ? Medicines to help mature the babys lungs. These may be prescribed if the  risk of delivery is high. ? Medicines to prevent your baby from developing cerebral palsy. If the labor happens before 34 weeks  of pregnancy, you may need to stay in the hospital. What should I do if I think I am in preterm labor? If you think that you are going into preterm labor, call your health care provider right away. How can I prevent preterm labor in future pregnancies? To increase your chance of having a full-term pregnancy:  Do not use any tobacco products, such as cigarettes, chewing tobacco, and e-cigarettes. If you need help quitting, ask your health care provider.  Do not use street drugs or medicines that have not been prescribed to you during your pregnancy.  Talk with your health care provider before taking any herbal supplements, even if you have been taking them regularly.  Make sure you gain a healthy amount of weight during your pregnancy.  Watch for infection. If you think that you might have an infection, get it checked right away.  Make sure to tell your health care provider if you have gone into preterm labor before. This information is not intended to replace advice given to you by your health care provider. Make sure you discuss any questions you have with your health care provider. Document Revised: 12/19/2018 Document Reviewed: 01/18/2016 Elsevier Patient Education  [place].       Marijuana Use During Pregnancy and Breastfeeding  Marijuana is the dried leaves, flowers, and stems of the Cannabis Ketra or Cannabis indica plant. The plant's active ingredients (cannabinoids), including a chemical called THC, change the chemistry of the brain. Marijuana smoke also has many of the same chemicals as cigarette smoke that cause breathing problems. Marijuana gets into your blood through your lungs when you smoke it and through your digestive system when you swallow it. Using marijuana in any form may be harmful for you and your baby when you are trying to  become pregnant and during pregnancy. This includes marijuana that is prescribed to you by a health care provider (medical marijuana). Once marijuana is in your blood, it can travel through your placenta to your baby. It may also pass through breast milk. How does this affect me? Marijuana affects you both mentally and physically. Using marijuana can make you feel high and relaxed. It can also have negative effects, especially at high doses or with long-term use. These include:  Rapid heartbeat and stress on your heart.  Lung irritation and breathing problems.  Difficulty thinking and making decisions.  Seeing or believing things that are not true (hallucinations and paranoia).  Mood swings, depression, or anxiety.  Decreased ability to learn and remember.  Difficulty getting pregnant. Marijuana can also affect your pregnancy. Not all the effects are known. However, if you use marijuana during pregnancy, you may:  Be less likely to get regular prenatal care and do the things that you need to do to have a healthy pregnancy.  Be more likely to use other drugs that can harm your pregnancy, like drinking alcohol and smoking cigarettes.  Be at higher risk of having your baby die after 28 weeks of pregnancy (stillbirth).  Be at higher risk of giving birth before 37 weeks of pregnancy (premature birth). How does this affect my baby? If you use marijuana during pregnancy, this may affect your baby's development, birth, and life after birth. Your baby may:  Be born prematurely, which can cause physical and mental problems.  Be born with a low birth weight, which can lead to physical and mental problems.  Have problems with brain development.  Have difficulty growing.  Have attention and behavior  problems later in life.  Do poorly at school and have learning problems later in life.  Have problems with vision and coordination.  Be at higher risk for using marijuana by age 8. More  research is needed to find out exactly how marijuana affects a baby during breastfeeding. Some studies suggest that the chemicals in marijuana can be passed to a baby through breast milk. To limit possible risks, you should not use marijuana during breastfeeding. Follow these instructions at home:  Let your health care provider know if you use marijuana before trying to get pregnant, during pregnancy, or during breastfeeding.  Do not use marijuana in any form when you are trying to get pregnant, when you are pregnant, or when you are breastfeeding. If you are having trouble stopping marijuana use, ask your health care provider for help.  Do not smoke. If you need help quitting, ask your health care provider for help.  If you are using medical marijuana, ask your health care provider to switch you to a medicine that is safer to use during pregnancy or breastfeeding.  Keep all your prenatal visits as told by your health care provider. This is important. Where to find more information [employer] on Drug Abuse: www.drugabuse.gov March of Dimes: www.marchofdimes.org/pregnancy Contact a health care provider if:  You use marijuana and want to get pregnant.  You use marijuana during pregnancy or breastfeeding.  You need help stopping marijuana use. Get help right away if:  Your baby is not gaining weight or growing as expected. Summary  Using marijuana in any form may be harmful for you and your baby when you are trying to become pregnant, during pregnancy, and during breastfeeding. This includes marijuana that is prescribed to you (medical marijuana).  Some studies suggest that marijuana may pass through breast milk and can affect your baby's brain development.  Talk to your health care provider if you use marijuana in any form while trying to get pregnant, during pregnancy, or while breastfeeding.  Ask your health care provider for help if you are not able to stop using  marijuana. This information is not intended to replace advice given to you by your health care provider. Make sure you discuss any questions you have with your health care provider. Document Revised: 12/19/2018 Document Reviewed: 05/15/2017 Elsevier Patient Education  [place].

## 2020-01-15 NOTE — Telephone Encounter (Signed)
Patient is 32 weeks and having bad lower cramps. Requesting to speak with a nurse ASAP. State she has never had pain like this before, very sharp pains that are 3 to 4 mins apart

## 2020-01-16 LAB — CULTURE, OB URINE: Culture: NO GROWTH

## 2020-01-16 LAB — URINE CULTURE, OB REFLEX

## 2020-01-17 LAB — HEPATITIS C VRS RNA DETECT BY PCR-QUAL: Hepatitis C Vrs RNA by PCR-Qual: NEGATIVE

## 2020-01-18 ENCOUNTER — Telehealth (INDEPENDENT_AMBULATORY_CARE_PROVIDER_SITE_OTHER): Payer: Medicaid Other | Admitting: Family Medicine

## 2020-01-18 DIAGNOSIS — O0990 Supervision of high risk pregnancy, unspecified, unspecified trimester: Secondary | ICD-10-CM

## 2020-01-18 LAB — DRUG SCREEN 764883 11+OXYCO+ALC+CRT-BUND

## 2020-01-18 LAB — GC/CHLAMYDIA PROBE AMP (~~LOC~~) NOT AT ARMC
Chlamydia: NEGATIVE
Comment: NEGATIVE
Comment: NORMAL
Neisseria Gonorrhea: NEGATIVE

## 2020-01-18 MED ORDER — FAMOTIDINE 20 MG PO TABS
20.0000 mg | ORAL_TABLET | Freq: Two times a day (BID) | ORAL | 1 refills | Status: DC
Start: 2020-01-18 — End: 2020-03-08

## 2020-01-18 MED ORDER — PROMETHAZINE HCL 25 MG PO TABS
25.0000 mg | ORAL_TABLET | Freq: Four times a day (QID) | ORAL | 0 refills | Status: DC | PRN
Start: 2020-01-18 — End: 2020-03-08

## 2020-01-18 NOTE — Telephone Encounter (Signed)
Per chart review, pt went to [facility] for evaluation less than 2 hours after her call to our office.

## 2020-01-18 NOTE — Telephone Encounter (Signed)
Patient called, state she has been unable to keep anything down for the past couple of days want to know what she can do.

## 2020-01-18 NOTE — Telephone Encounter (Addendum)
Returned patients call. Phone would pick up but noone would be there. Did get an answering maching on the 3rd try and LM that meds for N&V have been sent to her pharmacy.   Patient called back to say that she has not been able to hold anything down the last 2 days. She reports she cannot hold down water most of the time. Patient to pick up medications and if not better in the next few days will return to the [facility] for reevaluation.

## 2020-01-19 ENCOUNTER — Other Ambulatory Visit: Payer: Self-pay | Admitting: *Deleted

## 2020-01-19 ENCOUNTER — Encounter: Payer: Self-pay | Admitting: Obstetrics and Gynecology

## 2020-01-19 DIAGNOSIS — O0990 Supervision of high risk pregnancy, unspecified, unspecified trimester: Secondary | ICD-10-CM

## 2020-01-19 DIAGNOSIS — D259 Leiomyoma of uterus, unspecified: Secondary | ICD-10-CM | POA: Insufficient documentation

## 2020-01-19 DIAGNOSIS — O4440 Low lying placenta NOS or without hemorrhage, unspecified trimester: Secondary | ICD-10-CM

## 2020-01-19 DIAGNOSIS — O3410 Maternal care for benign tumor of corpus uteri, unspecified trimester: Secondary | ICD-10-CM | POA: Insufficient documentation

## 2020-01-19 DIAGNOSIS — F1210 Cannabis abuse, uncomplicated: Secondary | ICD-10-CM | POA: Insufficient documentation

## 2020-01-20 ENCOUNTER — Other Ambulatory Visit: Payer: Self-pay

## 2020-01-20 ENCOUNTER — Other Ambulatory Visit: Payer: Medicaid Other

## 2020-01-20 DIAGNOSIS — O0990 Supervision of high risk pregnancy, unspecified, unspecified trimester: Secondary | ICD-10-CM

## 2020-01-20 DIAGNOSIS — O4440 Low lying placenta NOS or without hemorrhage, unspecified trimester: Secondary | ICD-10-CM

## 2020-01-21 LAB — SPECIMEN STATUS REPORT

## 2020-01-21 LAB — HIV ANTIBODY (ROUTINE TESTING W REFLEX): HIV Screen 4th Generation wRfx: NONREACTIVE

## 2020-01-21 LAB — CBC
Hematocrit: 33.6 % — ABNORMAL LOW (ref 34.0–46.6)
Hemoglobin: 11 g/dL — ABNORMAL LOW (ref 11.1–15.9)
MCH: 26.4 pg — ABNORMAL LOW (ref 26.6–33.0)
MCHC: 32.7 g/dL (ref 31.5–35.7)
MCV: 81 fL (ref 79–97)
Platelets: 322 10*3/uL (ref 150–450)
RBC: 4.17 x10E6/uL (ref 3.77–5.28)
RDW: 14.6 % (ref 11.7–15.4)
WBC: 8.1 10*3/uL (ref 3.4–10.8)

## 2020-01-21 LAB — GLUCOSE TOLERANCE, 2 HOURS W/ 1HR
Glucose, 1 hour: 97 mg/dL (ref 65–179)
Glucose, 2 hour: 92 mg/dL (ref 65–152)
Glucose, Fasting: 78 mg/dL (ref 65–91)

## 2020-01-21 LAB — RPR: RPR Ser Ql: NONREACTIVE

## 2020-01-22 LAB — OBSTETRIC PANEL, INCLUDING HIV
Antibody Screen: NEGATIVE
Basophils Absolute: 0 10*3/uL (ref 0.0–0.2)
Basos: 1 %
EOS (ABSOLUTE): 0.1 10*3/uL (ref 0.0–0.4)
Eos: 1 %
HIV Screen 4th Generation wRfx: NONREACTIVE
Hematocrit: 34.5 % (ref 34.0–46.6)
Hemoglobin: 11.2 g/dL (ref 11.1–15.9)
Hepatitis B Surface Ag: NEGATIVE
Immature Grans (Abs): 0.1 10*3/uL (ref 0.0–0.1)
Immature Granulocytes: 1 %
Lymphocytes Absolute: 2.2 10*3/uL (ref 0.7–3.1)
Lymphs: 25 %
MCH: 26.5 pg — ABNORMAL LOW (ref 26.6–33.0)
MCHC: 32.5 g/dL (ref 31.5–35.7)
MCV: 82 fL (ref 79–97)
Monocytes Absolute: 0.5 10*3/uL (ref 0.1–0.9)
Monocytes: 6 %
Neutrophils Absolute: 5.9 10*3/uL (ref 1.4–7.0)
Neutrophils: 66 %
Platelets: 299 10*3/uL (ref 150–450)
RBC: 4.22 x10E6/uL (ref 3.77–5.28)
RDW: 14.4 % (ref 11.7–15.4)
RPR Ser Ql: NONREACTIVE
Rh Factor: POSITIVE
Rubella Antibodies, IGG: 1.03 index (ref >0.99)
WBC: 8.7 10*3/uL (ref 3.4–10.8)

## 2020-01-22 LAB — CMP AND LIVER
ALT: 5 IU/L (ref 0–32)
AST: 11 IU/L (ref 0–40)
Albumin: 3.2 g/dL — ABNORMAL LOW (ref 3.8–4.8)
Alkaline Phosphatase: 103 IU/L (ref 39–117)
BUN: 11 mg/dL (ref 6–20)
Bilirubin Total: <0.2 mg/dL (ref 0.0–1.2)
Bilirubin, Direct: 0.08 mg/dL (ref 0.00–0.40)
CO2: 16 mmol/L — ABNORMAL LOW (ref 20–29)
Calcium: 8.7 mg/dL (ref 8.7–10.2)
Chloride: 106 mmol/L (ref 96–106)
Creatinine, Ser: 0.46 mg/dL — ABNORMAL LOW (ref 0.57–1.00)
GFR calc Af Amer: 151 mL/min/{1.73_m2} (ref >59)
GFR calc non Af Amer: 131 mL/min/{1.73_m2} (ref >59)
Glucose: 74 mg/dL (ref 65–99)
Potassium: 4.4 mmol/L (ref 3.5–5.2)
Sodium: 136 mmol/L (ref 134–144)
Total Protein: 6.5 g/dL (ref 6.0–8.5)

## 2020-01-22 LAB — HEMOGLOBIN A1C
Est. average glucose Bld gHb Est-mCnc: 117 mg/dL
Hgb A1c MFr Bld: 5.7 % — ABNORMAL HIGH (ref 4.8–5.6)

## 2020-01-22 LAB — GLUCOSE TOLERANCE, 2 HOURS W/ 1HR
Glucose, 1 hour: 107 mg/dL (ref 65–179)
Glucose, Fasting: 74 mg/dL (ref 65–91)

## 2020-01-28 ENCOUNTER — Ambulatory Visit: Payer: Medicaid Other | Attending: Obstetrics and Gynecology

## 2020-01-28 ENCOUNTER — Other Ambulatory Visit: Payer: Self-pay

## 2020-01-28 ENCOUNTER — Ambulatory Visit (HOSPITAL_COMMUNITY): Payer: Medicaid Other

## 2020-01-28 ENCOUNTER — Ambulatory Visit: Payer: Medicaid Other | Admitting: *Deleted

## 2020-01-28 DIAGNOSIS — A599 Trichomoniasis, unspecified: Secondary | ICD-10-CM | POA: Insufficient documentation

## 2020-01-28 DIAGNOSIS — E669 Obesity, unspecified: Secondary | ICD-10-CM

## 2020-01-28 DIAGNOSIS — O0990 Supervision of high risk pregnancy, unspecified, unspecified trimester: Secondary | ICD-10-CM | POA: Diagnosis not present

## 2020-01-28 DIAGNOSIS — O3410 Maternal care for benign tumor of corpus uteri, unspecified trimester: Secondary | ICD-10-CM

## 2020-01-28 DIAGNOSIS — O0933 Supervision of pregnancy with insufficient antenatal care, third trimester: Secondary | ICD-10-CM | POA: Diagnosis not present

## 2020-01-28 DIAGNOSIS — Z3A34 34 weeks gestation of pregnancy: Secondary | ICD-10-CM

## 2020-01-28 DIAGNOSIS — O4403 Placenta previa specified as without hemorrhage, third trimester: Secondary | ICD-10-CM | POA: Diagnosis not present

## 2020-01-28 DIAGNOSIS — Z363 Encounter for antenatal screening for malformations: Secondary | ICD-10-CM | POA: Diagnosis not present

## 2020-01-28 DIAGNOSIS — O99213 Obesity complicating pregnancy, third trimester: Secondary | ICD-10-CM

## 2020-02-01 ENCOUNTER — Encounter: Payer: Self-pay | Admitting: Family Medicine

## 2020-02-01 ENCOUNTER — Other Ambulatory Visit: Payer: Self-pay

## 2020-02-01 ENCOUNTER — Ambulatory Visit (INDEPENDENT_AMBULATORY_CARE_PROVIDER_SITE_OTHER): Payer: Medicaid Other | Admitting: Family Medicine

## 2020-02-01 VITALS — BP 111/63 | HR 96

## 2020-02-01 DIAGNOSIS — D259 Leiomyoma of uterus, unspecified: Secondary | ICD-10-CM

## 2020-02-01 DIAGNOSIS — Z3A35 35 weeks gestation of pregnancy: Secondary | ICD-10-CM

## 2020-02-01 DIAGNOSIS — O0990 Supervision of high risk pregnancy, unspecified, unspecified trimester: Secondary | ICD-10-CM

## 2020-02-01 DIAGNOSIS — O0993 Supervision of high risk pregnancy, unspecified, third trimester: Secondary | ICD-10-CM

## 2020-02-01 DIAGNOSIS — O3410 Maternal care for benign tumor of corpus uteri, unspecified trimester: Secondary | ICD-10-CM

## 2020-02-01 NOTE — Progress Notes (Signed)
   PRENATAL VISIT NOTE  Subjective:  Marguerite Eighmy is a 34 y.o. G1P0 at [redacted]w[redacted]d being seen today for ongoing prenatal care.  She is currently monitored for the following issues for this high-risk pregnancy and has Supervision of high risk pregnancy, antepartum; Late prenatal care; Trichomoniasis; Uterine fibroid in pregnancy; and Marijuana abuse on their problem list.  Patient reports no complaints.  Contractions: Not present. Vag. Bleeding: None.  Movement: Present. Denies leaking of fluid.   The following portions of the patient's history were reviewed and updated as appropriate: allergies, current medications, past family history, past medical history, past social history, past surgical history and problem list.   Objective:   Vitals:   02/01/20 0949  BP: 111/63  Pulse: 96    Fetal Status: Fetal Heart Rate (bpm): 143 Fundal Height: 35 cm Movement: Present     General:  Alert, oriented and cooperative. Patient is in no acute distress.  Skin: Skin is warm and dry. No rash noted.   Cardiovascular: Normal heart rate noted  Respiratory: Normal respiratory effort, no problems with respiration noted  Abdomen: Soft, gravid, appropriate for gestational age.  Pain/Pressure: Absent     Pelvic: Cervical exam deferred        Extremities: Normal range of motion.  Edema: None  Mental Status: Normal mood and affect. Normal behavior. Normal judgment and thought content.   Assessment and Plan:  Pregnancy: G1P0 at [redacted]w[redacted]d 1. Supervision of high risk pregnancy, antepartum FHT and FH normal  2. Uterine fibroid in pregnancy Good growth  Preterm labor symptoms and general obstetric precautions including but not limited to vaginal bleeding, contractions, leaking of fluid and fetal movement were reviewed in detail with the patient. Please refer to After Visit Summary for other counseling recommendations.   Return in about 1 week (around 02/08/2020) for HR OB f/u, GBS, In Office.  No future  appointments.  Truett Mainland, DO

## 2020-02-01 NOTE — Patient Instructions (Signed)
Etonogestrel implant What is this medicine? ETONOGESTREL (et oh noe JES trel) is a contraceptive (birth control) device. It is used to prevent pregnancy. It can be used for up to 3 years. This medicine may be used for other purposes; ask your health care provider or pharmacist if you have questions. COMMON BRAND NAME(S): Implanon, Nexplanon What should I tell my health care provider before I take this medicine? They need to know if you have any of these conditions:  abnormal vaginal bleeding  blood vessel disease or blood clots  breast, cervical, endometrial, ovarian, liver, or uterine cancer  diabetes  gallbladder disease  heart disease or recent heart attack  high blood pressure  high cholesterol or triglycerides  kidney disease  liver disease  migraine headaches  seizures  stroke  tobacco smoker  an unusual or allergic reaction to etonogestrel, anesthetics or antiseptics, other medicines, foods, dyes, or preservatives  pregnant or trying to get pregnant  breast-feeding How should I use this medicine? This device is inserted just under the skin on the inner side of your upper arm by a health care professional. Talk to your pediatrician regarding the use of this medicine in children. Special care may be needed. Overdosage: If you think you have taken too much of this medicine contact a poison control center or emergency room at once. NOTE: This medicine is only for you. Do not share this medicine with others. What if I miss a dose? This does not apply. What may interact with this medicine? Do not take this medicine with any of the following medications:  amprenavir  fosamprenavir This medicine may also interact with the following medications:  acitretin  aprepitant  armodafinil  bexarotene  bosentan  carbamazepine  certain medicines for fungal infections like fluconazole, ketoconazole, itraconazole and voriconazole  certain medicines to treat  hepatitis, HIV or AIDS  cyclosporine  felbamate  griseofulvin  lamotrigine  modafinil  oxcarbazepine  phenobarbital  phenytoin  primidone  rifabutin  rifampin  rifapentine  St. John's wort  topiramate This list may not describe all possible interactions. Give your health care provider a list of all the medicines, herbs, non-prescription drugs, or dietary supplements you use. Also tell them if you smoke, drink alcohol, or use illegal drugs. Some items may interact with your medicine. What should I watch for while using this medicine? This product does not protect you against HIV infection (AIDS) or other sexually transmitted diseases. You should be able to feel the implant by pressing your fingertips over the skin where it was inserted. Contact your doctor if you cannot feel the implant, and use a non-hormonal birth control method (such as condoms) until your doctor confirms that the implant is in place. Contact your doctor if you think that the implant may have broken or become bent while in your arm. You will receive a user card from your health care provider after the implant is inserted. The card is a record of the location of the implant in your upper arm and when it should be removed. Keep this card with your health records. What side effects may I notice from receiving this medicine? Side effects that you should report to your doctor or health care professional as soon as possible:  allergic reactions like skin rash, itching or hives, swelling of the face, lips, or tongue  breast lumps, breast tissue changes, or discharge  breathing problems  changes in emotions or moods  coughing up blood  if you feel that the implant   may have broken or bent while in your arm  high blood pressure  pain, irritation, swelling, or bruising at the insertion site  scar at site of insertion  signs of infection at the insertion site such as fever, and skin redness, pain or  discharge  signs and symptoms of a blood clot such as breathing problems; changes in vision; chest pain; severe, sudden headache; pain, swelling, warmth in the leg; trouble speaking; sudden numbness or weakness of the face, arm or leg  signs and symptoms of liver injury like dark yellow or brown urine; general ill feeling or flu-like symptoms; light-colored stools; loss of appetite; nausea; right upper belly pain; unusually weak or tired; yellowing of the eyes or skin  unusual vaginal bleeding, discharge Side effects that usually do not require medical attention (report to your doctor or health care professional if they continue or are bothersome):  acne  breast pain or tenderness  headache  irregular menstrual bleeding  nausea This list may not describe all possible side effects. Call your doctor for medical advice about side effects. You may report side effects to FDA at 1-800-FDA-1088. Where should I keep my medicine? This drug is given in a hospital or clinic and will not be stored at home. NOTE: This sheet is a summary. It may not cover all possible information. If you have questions about this medicine, talk to your doctor, pharmacist, or health care provider.  2020 Elsevier/Gold Standard (2019-06-09 11:33:04)   Levonorgestrel intrauterine device (IUD) What is this medicine? LEVONORGESTREL IUD (LEE voe nor jes trel) is a contraceptive (birth control) device. The device is placed inside the uterus by a healthcare professional. It is used to prevent pregnancy. This device can also be used to treat heavy bleeding that occurs during your period. This medicine may be used for other purposes; ask your health care provider or pharmacist if you have questions. COMMON BRAND NAME(S): Minette Headland What should I tell my health care provider before I take this medicine? They need to know if you have any of these conditions:  abnormal Pap smear  cancer of the breast,  uterus, or cervix  diabetes  endometritis  genital or pelvic infection now or in the past  have more than one sexual partner or your partner has more than one partner  heart disease  history of an ectopic or tubal pregnancy  immune system problems  IUD in place  liver disease or tumor  problems with blood clots or take blood-thinners  seizures  use intravenous drugs  uterus of unusual shape  vaginal bleeding that has not been explained  an unusual or allergic reaction to levonorgestrel, other hormones, silicone, or polyethylene, medicines, foods, dyes, or preservatives  pregnant or trying to get pregnant  breast-feeding How should I use this medicine? This device is placed inside the uterus by a health care professional. Talk to your pediatrician regarding the use of this medicine in children. Special care may be needed. Overdosage: If you think you have taken too much of this medicine contact a poison control center or emergency room at once. NOTE: This medicine is only for you. Do not share this medicine with others. What if I miss a dose? This does not apply. Depending on the brand of device you have inserted, the device will need to be replaced every 3 to 6 years if you wish to continue using this type of birth control. What may interact with this medicine? Do not take this medicine  with any of the following medications:  amprenavir  bosentan  fosamprenavir This medicine may also interact with the following medications:  aprepitant  armodafinil  barbiturate medicines for inducing sleep or treating seizures  bexarotene  boceprevir  griseofulvin  medicines to treat seizures like carbamazepine, ethotoin, felbamate, oxcarbazepine, phenytoin, topiramate  modafinil  pioglitazone  rifabutin  rifampin  rifapentine  some medicines to treat HIV infection like atazanavir, efavirenz, indinavir, lopinavir, nelfinavir, tipranavir, ritonavir  St.  John's wort  warfarin This list may not describe all possible interactions. Give your health care provider a list of all the medicines, herbs, non-prescription drugs, or dietary supplements you use. Also tell them if you smoke, drink alcohol, or use illegal drugs. Some items may interact with your medicine. What should I watch for while using this medicine? Visit your doctor or health care professional for regular check ups. See your doctor if you or your partner has sexual contact with others, becomes HIV positive, or gets a sexual transmitted disease. This product does not protect you against HIV infection (AIDS) or other sexually transmitted diseases. You can check the placement of the IUD yourself by reaching up to the top of your vagina with clean fingers to feel the threads. Do not pull on the threads. It is a good habit to check placement after each menstrual period. Call your doctor right away if you feel more of the IUD than just the threads or if you cannot feel the threads at all. The IUD may come out by itself. You may become pregnant if the device comes out. If you notice that the IUD has come out use a backup birth control method like condoms and call your health care provider. Using tampons will not change the position of the IUD and are okay to use during your period. This IUD can be safely scanned with magnetic resonance imaging (MRI) only under specific conditions. Before you have an MRI, tell your healthcare provider that you have an IUD in place, and which type of IUD you have in place. What side effects may I notice from receiving this medicine? Side effects that you should report to your doctor or health care professional as soon as possible:  allergic reactions like skin rash, itching or hives, swelling of the face, lips, or tongue  fever, flu-like symptoms  genital sores  high blood pressure  no menstrual period for 6 weeks during use  pain, swelling, warmth in the  leg  pelvic pain or tenderness  severe or sudden headache  signs of pregnancy  stomach cramping  sudden shortness of breath  trouble with balance, talking, or walking  unusual vaginal bleeding, discharge  yellowing of the eyes or skin Side effects that usually do not require medical attention (report to your doctor or health care professional if they continue or are bothersome):  acne  breast pain  change in sex drive or performance  changes in weight  cramping, dizziness, or faintness while the device is being inserted  headache  irregular menstrual bleeding within first 3 to 6 months of use  nausea This list may not describe all possible side effects. Call your doctor for medical advice about side effects. You may report side effects to FDA at 1-800-FDA-1088. Where should I keep my medicine? This does not apply. NOTE: This sheet is a summary. It may not cover all possible information. If you have questions about this medicine, talk to your doctor, pharmacist, or health care provider.  2020 Elsevier/Gold Standard (  2018-07-08 13:22:01)  

## 2020-02-09 ENCOUNTER — Other Ambulatory Visit: Payer: Self-pay

## 2020-02-09 ENCOUNTER — Other Ambulatory Visit (HOSPITAL_COMMUNITY)
Admission: RE | Admit: 2020-02-09 | Discharge: 2020-02-09 | Disposition: A | Discharge status: Home / Self Care | Payer: Medicaid Other | Source: Ambulatory Visit | Attending: Obstetrics and Gynecology | Admitting: Obstetrics and Gynecology

## 2020-02-09 ENCOUNTER — Ambulatory Visit (INDEPENDENT_AMBULATORY_CARE_PROVIDER_SITE_OTHER): Payer: Medicaid Other | Admitting: Obstetrics and Gynecology

## 2020-02-09 VITALS — BP 115/69 | HR 92 | Wt 238.0 lb

## 2020-02-09 DIAGNOSIS — A5901 Trichomonal vulvovaginitis: Secondary | ICD-10-CM

## 2020-02-09 DIAGNOSIS — O0990 Supervision of high risk pregnancy, unspecified, unspecified trimester: Secondary | ICD-10-CM

## 2020-02-09 DIAGNOSIS — O23593 Infection of other part of genital tract in pregnancy, third trimester: Secondary | ICD-10-CM

## 2020-02-09 DIAGNOSIS — Z3A36 36 weeks gestation of pregnancy: Secondary | ICD-10-CM

## 2020-02-09 NOTE — Progress Notes (Signed)
  HIGH-RISK PREGNANCY OFFICE VISIT Patient name: Lynn Duncan MRN [id number]  Date of birth: [birth year] Chief Complaint:   Routine Prenatal Visit  History of Present Illness:   Lynn Duncan is a 33 y.o. G1P0 female at [redacted]w[redacted]d with an Estimated Date of Delivery: 03/05/20 being seen today for ongoing management of a high-risk pregnancy complicated by Late to Prenatal care, Trichomoniasis, Uterine Fibroid in pregnancy, and marijuana use in pregnancy Today she reports no complaints. She states she is "ready for this baby to come." Contractions: Not present. Vag. Bleeding: None.  Movement: Present. denies leaking of fluid.  Review of Systems:   Pertinent items are noted in HPI Denies abnormal vaginal discharge w/ itching/odor/irritation, headaches, visual changes, shortness of breath, chest pain, abdominal pain, severe nausea/vomiting, or problems with urination or bowel movements unless otherwise stated above. Pertinent History Reviewed:  Reviewed past medical,surgical, social, obstetrical and family history.  Reviewed problem list, medications and allergies. Physical Assessment:   Vitals:   02/09/20 1103  BP: 115/69  Pulse: 92  Weight: 238 lb (108 kg)  Body mass index is 40.85 kg/m.           Physical Examination:   General appearance: alert, well appearing, and in no distress and oriented to person, place, and time  Mental status: alert, oriented to person, place, and time, normal mood, behavior, speech, dress, motor activity, and thought processes  Skin: warm & dry   Extremities: Edema: None    Cardiovascular: normal heart rate noted  Respiratory: normal respiratory effort, no distress  Abdomen: gravid, soft, non-tender  Pelvic: Cervical exam performed  Dilation: Closed Effacement (%): Thick Station: Ballotable  Fetal Status: Fetal Heart Rate (bpm): 149 Fundal Height: 38 cm Movement: Present Presentation: Undeterminable  Fetal Surveillance Testing today: none   No results found  for this or any previous visit (from the past 24 hour(s)).  Assessment & Plan:  1) High-risk pregnancy G1P0 at [redacted]w[redacted]d with an Estimated Date of Delivery: 03/05/20   2) Supervision of high risk pregnancy, antepartum  - Culture, beta strep (group b only),  - Cervicovaginal ancillary only( [place]),   3) Trichomonal vaginitis during pregnancy in third trimester - TOC today; yellowish-green vaginal discharge present; suspicious for infection  Meds: No orders of the defined types were placed in this encounter.   Labs/procedures today: GBS, Wet Prep  Reviewed: Preterm labor symptoms and general obstetric precautions including but not limited to vaginal bleeding, contractions, leaking of fluid and fetal movement were reviewed in detail with the patient.  All questions were answered. Has home bp cuff. Check bp weekly, let us know if >140/90.   Follow-up: Return in about 2 weeks (around 02/23/2020) for Return OB - My Chart video.  Orders Placed This Encounter  Procedures  . Culture, beta strep (group b only)   Laury Deep MSN, CNM 02/09/2020

## 2020-02-10 ENCOUNTER — Inpatient Hospital Stay (HOSPITAL_COMMUNITY)
Admission: AD | Admit: 2020-02-10 | Discharge: 2020-02-10 | Disposition: A | Discharge status: Home / Self Care | Payer: Medicaid Other | Attending: Obstetrics and Gynecology | Admitting: Obstetrics and Gynecology

## 2020-02-10 ENCOUNTER — Encounter (HOSPITAL_COMMUNITY): Payer: Self-pay | Admitting: Obstetrics and Gynecology

## 2020-02-10 DIAGNOSIS — Z3A36 36 weeks gestation of pregnancy: Secondary | ICD-10-CM | POA: Diagnosis not present

## 2020-02-10 DIAGNOSIS — O4703 False labor before 37 completed weeks of gestation, third trimester: Secondary | ICD-10-CM | POA: Diagnosis present

## 2020-02-10 DIAGNOSIS — O479 False labor, unspecified: Secondary | ICD-10-CM

## 2020-02-10 LAB — CERVICOVAGINAL ANCILLARY ONLY
Bacterial Vaginitis (gardnerella): POSITIVE — AB
Candida Glabrata: NEGATIVE
Candida Vaginitis: POSITIVE — AB
Chlamydia: NEGATIVE
Comment: NEGATIVE
Comment: NEGATIVE
Comment: NEGATIVE
Comment: NEGATIVE
Comment: NEGATIVE
Comment: NORMAL
Neisseria Gonorrhea: NEGATIVE
Trichomonas: NEGATIVE

## 2020-02-10 NOTE — Discharge Instructions (Signed)
Braxton Hicks Contractions °Contractions of the uterus can occur throughout pregnancy, but they are not always a sign that you are in labor. You may have practice contractions called Braxton Hicks contractions. These false labor contractions are sometimes confused with true labor. °What are Braxton Hicks contractions? °Braxton Hicks contractions are tightening movements that occur in the muscles of the uterus before labor. Unlike true labor contractions, these contractions do not result in opening (dilation) and thinning of the cervix. Toward the end of pregnancy (32-34 weeks), Braxton Hicks contractions can happen more often and may become stronger. These contractions are sometimes difficult to tell apart from true labor because they can be very uncomfortable. You should not feel embarrassed if you go to the hospital with false labor. °Sometimes, the only way to tell if you are in true labor is for your health care provider to look for changes in the cervix. The health care provider will do a physical exam and may monitor your contractions. If you are not in true labor, the exam should show that your cervix is not dilating and your water has not broken. °If there are no other health problems associated with your pregnancy, it is completely safe for you to be sent home with false labor. You may continue to have Braxton Hicks contractions until you go into true labor. °How to tell the difference between true labor and false labor °True labor °· Contractions last 30-70 seconds. °· Contractions become very regular. °· Discomfort is usually felt in the top of the uterus, and it spreads to the lower abdomen and low back. °· Contractions do not go away with walking. °· Contractions usually become more intense and increase in frequency. °· The cervix dilates and gets thinner. °False labor °· Contractions are usually shorter and not as strong as true labor contractions. °· Contractions are usually irregular. °· Contractions  are often felt in the front of the lower abdomen and in the groin. °· Contractions may go away when you walk around or change positions while lying down. °· Contractions get weaker and are shorter-lasting as time goes on. °· The cervix usually does not dilate or become thin. °Follow these instructions at home: ° °· Take over-the-counter and prescription medicines only as told by your health care provider. °· Keep up with your usual exercises and follow other instructions from your health care provider. °· Eat and drink lightly if you think you are going into labor. °· If Braxton Hicks contractions are making you uncomfortable: °? Change your position from lying down or resting to walking, or change from walking to resting. °? Sit and rest in a tub of warm water. °? Drink enough fluid to keep your urine pale yellow. Dehydration may cause these contractions. °? Do slow and deep breathing several times an hour. °· Keep all follow-up prenatal visits as told by your health care provider. This is important. °Contact a health care provider if: °· You have a fever. °· You have continuous pain in your abdomen. °Get help right away if: °· Your contractions become stronger, more regular, and closer together. °· You have fluid leaking or gushing from your vagina. °· You pass blood-tinged mucus (bloody show). °· You have bleeding from your vagina. °· You have low back pain that you never had before. °· You feel your baby’s head pushing down and causing pelvic pressure. °· Your baby is not moving inside you as much as it used to. °Summary °· Contractions that occur before labor are   called Braxton Hicks contractions, false labor, or practice contractions. °· Braxton Hicks contractions are usually shorter, weaker, farther apart, and less regular than true labor contractions. True labor contractions usually become progressively stronger and regular, and they become more frequent. °· Manage discomfort from Braxton Hicks contractions  by changing position, resting in a warm bath, drinking plenty of water, or practicing deep breathing. °This information is not intended to replace advice given to you by your health care provider. Make sure you discuss any questions you have with your health care provider. °Document Revised: 08/09/2017 Document Reviewed: 01/10/2017 °Elsevier Patient Education © 2020 Else[employer] ° °

## 2020-02-10 NOTE — MAU Provider Note (Addendum)
S: Lynn Duncan is a 34 y.o. G1P0 at [redacted]w[redacted]d  who presents to MAU today complaining contractions q 6 minutes since about 2 PM today. She denies vaginal bleeding. She denies LOF. She reports normal fetal movement.    O: BP (!) 121/57 (BP Location: Right Arm)   Pulse 83   Temp 98.2 F (36.8 C) (Oral)   Resp 20   LMP 05/30/2019   SpO2 97%  GENERAL: Well-developed, well-nourished female in no acute distress.  HEAD: Normocephalic, atraumatic.  CHEST: Normal effort of breathing, regular heart rate ABDOMEN: Soft, nontender, gravid  Cervical exam:  Dilation: Closed Effacement (%): Thick Presentation: Undeterminable Exam by:: TLYTLE RN    Fetal Monitoring: Baseline: 135 Variability: Moderate Accelerations: Present Decelerations: Absent NST reactive  Contractions: Irregular/uterine irritability   A: SIUP at [redacted]w[redacted]d  False labor  P: Discharge home in stable condition.  Discussed labor precautions.  Follow-up for routine prenatal visit.     Lenna Sciara, MD  PGY-2 Resident, Family Medicine 02/10/2020 4:15 PM   I confirm that I have verified the information documented in the resident's note and that I have also personally reperformed the history, physical exam and all medical decision making activities.  I have verified that all services and findings are accurately documented in this student's note; and I agree with management and plan as outlined in the documentation. I have also made any necessary editorial changes.    Fatima Blank, [place] for [employer], [employer] 02/11/2020 7:21 PM

## 2020-02-10 NOTE — MAU Note (Signed)
I have communicated with Dr Nyoka Cowden and reviewed vital signs:  Vitals:   02/10/20 1541 02/10/20 1545  BP: 116/66   Pulse: 98   Resp:    Temp:    SpO2:  98%    Vaginal exam:  Dilation: Closed Effacement (%): Thick Presentation: Undeterminable Exam by:: TLYTLE RN ,   Also reviewed contraction pattern and that non-stress test is reactive.  It has been documented that patient is contracting every irregularly with a closed cervix not indicating active labor.  Patient denies any other complaints.  Based on this report provider has given order for discharge.  A discharge order and diagnosis entered by a provider.   Labor discharge instructions reviewed with patient.

## 2020-02-10 NOTE — MAU Note (Signed)
Pt arrived EMS w/contractions that started at 1400. Rating pain 10/10. States they are every 5 mins. Denies LOF, VB. _+FM.

## 2020-02-12 LAB — CULTURE, BETA STREP (GROUP B ONLY): Strep Gp B Culture: POSITIVE — AB

## 2020-02-17 ENCOUNTER — Telehealth (INDEPENDENT_AMBULATORY_CARE_PROVIDER_SITE_OTHER): Payer: Medicaid Other | Admitting: Lactation Services

## 2020-02-17 DIAGNOSIS — B373 Candidiasis of vulva and vagina: Secondary | ICD-10-CM

## 2020-02-17 DIAGNOSIS — B3731 Acute candidiasis of vulva and vagina: Secondary | ICD-10-CM

## 2020-02-17 DIAGNOSIS — B9689 Other specified bacterial agents as the cause of diseases classified elsewhere: Secondary | ICD-10-CM

## 2020-02-17 DIAGNOSIS — N760 Acute vaginitis: Secondary | ICD-10-CM

## 2020-02-17 MED ORDER — TERCONAZOLE 0.4 % VA CREA: 1.0000 | TOPICAL_CREAM | Freq: Every day | VAGINAL | 0 refills | Status: DC

## 2020-02-17 MED ORDER — METRONIDAZOLE 500 MG PO TABS: 500.0000 mg | ORAL_TABLET | Freq: Two times a day (BID) | ORAL | 0 refills | Status: DC

## 2020-02-17 NOTE — Telephone Encounter (Signed)
-----   Message from Laury Deep, [place] sent at 02/17/2020  2:02 PM EDT ----- Regarding: RE: Aptima Swab Yes. Treat for BV then advise to take Yeast medication after completion of Flagyl. Thank you. ----- Message ----- From: Donn Pierini, RN Sent: 02/17/2020  11:45 AM EDT To: Laury Deep, CNM Subject: Aptima Swab                                    Rolitta,   Please advise on treatment for BV and Yeast.   Thanks Ivin Booty, RN

## 2020-02-17 NOTE — Telephone Encounter (Signed)
Called patient to give her results of vaginal swab. Informed patient she is positive for BV and yeast. Advised that meds have been sent to her pharmacy and that she is to finish the Flagyl and then start the Terazol. Patient voiced understanding.

## 2020-02-19 ENCOUNTER — Encounter: Payer: Self-pay | Admitting: Lactation Services

## 2020-02-19 DIAGNOSIS — O99820 Streptococcus B carrier state complicating pregnancy: Secondary | ICD-10-CM | POA: Insufficient documentation

## 2020-02-23 ENCOUNTER — Encounter: Payer: Self-pay | Admitting: Family Medicine

## 2020-02-23 ENCOUNTER — Telehealth (INDEPENDENT_AMBULATORY_CARE_PROVIDER_SITE_OTHER): Payer: Medicaid Other | Admitting: Family Medicine

## 2020-02-23 ENCOUNTER — Other Ambulatory Visit: Payer: Self-pay

## 2020-02-23 DIAGNOSIS — O0930 Supervision of pregnancy with insufficient antenatal care, unspecified trimester: Secondary | ICD-10-CM

## 2020-02-23 DIAGNOSIS — R110 Nausea: Secondary | ICD-10-CM

## 2020-02-23 DIAGNOSIS — O3413 Maternal care for benign tumor of corpus uteri, third trimester: Secondary | ICD-10-CM

## 2020-02-23 DIAGNOSIS — O0993 Supervision of high risk pregnancy, unspecified, third trimester: Secondary | ICD-10-CM

## 2020-02-23 DIAGNOSIS — O3410 Maternal care for benign tumor of corpus uteri, unspecified trimester: Secondary | ICD-10-CM

## 2020-02-23 DIAGNOSIS — O0990 Supervision of high risk pregnancy, unspecified, unspecified trimester: Secondary | ICD-10-CM

## 2020-02-23 DIAGNOSIS — O218 Other vomiting complicating pregnancy: Secondary | ICD-10-CM

## 2020-02-23 DIAGNOSIS — A599 Trichomoniasis, unspecified: Secondary | ICD-10-CM

## 2020-02-23 DIAGNOSIS — O0933 Supervision of pregnancy with insufficient antenatal care, third trimester: Secondary | ICD-10-CM

## 2020-02-23 DIAGNOSIS — D259 Leiomyoma of uterus, unspecified: Secondary | ICD-10-CM

## 2020-02-23 MED ORDER — BLOOD PRESSURE KIT DEVI: 1.0000 | Freq: Once | 0 refills | Status: AC

## 2020-02-23 MED ORDER — ONDANSETRON 4 MG PO TBDP: 4.0000 mg | ORAL_TABLET | Freq: Three times a day (TID) | ORAL | 0 refills | Status: DC | PRN

## 2020-02-23 NOTE — Patient Instructions (Signed)

## 2020-02-23 NOTE — Progress Notes (Signed)
   TELEHEALTH OBSTETRICS VISIT ENCOUNTER NOTE  I connected with Jeanie Cooks on 02/23/20 at  2:35 PM EDT by MyChart video visit at home and verified that I am speaking with the correct person using two identifiers.   I discussed the limitations, risks, security and privacy concerns of performing an evaluation and management service by telephone and the availability of in person appointments. I also discussed with the patient that there may be a patient responsible charge related to this service. The patient expressed understanding and agreed to proceed.  Subjective:  Lynn Duncan is a 34 y.o. G1P0 at 7w3dbeing followed for ongoing prenatal care.  She is currently monitored for the following issues for this high-risk pregnancy and has Supervision of high risk pregnancy, antepartum; Late prenatal care; Trichomoniasis; Uterine fibroid in pregnancy; Marijuana abuse; and GBS (group B Streptococcus carrier), +RV culture, currently pregnant on their problem list.  Patient reports nausea with antibiotics and lightheadedness- not drinking much water. Reports fetal movement. Denies any contractions, bleeding or leaking of fluid.   The following portions of the patient's history were reviewed and updated as appropriate: allergies, current medications, past family history, past medical history, past social history, past surgical history and problem list.   Objective:   General:  Alert, oriented and cooperative.   Mental Status: Normal mood and affect perceived. Normal judgment and thought content.  Rest of physical exam deferred due to type of encounter  Assessment and Plan:  Pregnancy: G1P0 at 376w3d. Supervision of high risk pregnancy, antepartum - Continue routine prenatal care - Blood Pressure Monitoring (BLOOD PRESSURE KIT) DEVI; 1 Device by Does not apply route once for 1 dose.  Dispense: 1 each; Refill: 0 - Has decided on IUD for pp contraception  2. Trichomoniasis - TOC  negative  3. Nausea - on ABX for BV and yeast, throwing up within an hour after taking antibiotics - ondansetron (ZOFRAN ODT) 4 MG disintegrating tablet; Take 1 tablet (4 mg total) by mouth every 8 (eight) hours as needed for nausea or vomiting.  Dispense: 20 tablet; Refill: 0  4. Late prenatal care  5. Uterine fibroid in pregnancy - 9 cm anterior   Term labor symptoms and general obstetric precautions including but not limited to vaginal bleeding, contractions, leaking of fluid and fetal movement were reviewed in detail with the patient.  I discussed the assessment and treatment plan with the patient. The patient was provided an opportunity to ask questions and all were answered. The patient agreed with the plan and demonstrated an understanding of the instructions. The patient was advised to call back or seek an in-person office evaluation/[facility] any [facility] or concerning symptoms. Please refer to After Visit Summary for other counseling recommendations.   I provided 13 minutes of non-face-to-face time during this encounter.  Return for 1 week HOB.  [place]

## 2020-02-23 NOTE — Progress Notes (Signed)
I connected with  Lynn Duncan on 02/23/20 at  2:35 PM EDT by telephone and verified that I am speaking with the correct person using two identifiers.   I discussed the limitations, risks, security and privacy concerns of performing an evaluation and management service by telephone and the availability of in person appointments. I also discussed with the patient that there may be a patient responsible charge related to this service. The patient expressed understanding and agreed to proceed.  Pt reports being dizzy and lightheaded last night, this has happened previously. Pt reports feeling dehydrated at that time. Denies any other s/s of htn. Pt does not have BP cuff at home; ordered today to be delivered by [employer].  Annabell Howells, RN 02/23/2020  2:36 PM

## 2020-03-01 ENCOUNTER — Other Ambulatory Visit: Payer: Self-pay

## 2020-03-01 ENCOUNTER — Ambulatory Visit (INDEPENDENT_AMBULATORY_CARE_PROVIDER_SITE_OTHER): Payer: Medicaid Other | Admitting: Obstetrics and Gynecology

## 2020-03-01 ENCOUNTER — Encounter: Payer: Self-pay | Admitting: Obstetrics and Gynecology

## 2020-03-01 VITALS — BP 116/85 | HR 90 | Wt 242.0 lb

## 2020-03-01 DIAGNOSIS — O0990 Supervision of high risk pregnancy, unspecified, unspecified trimester: Secondary | ICD-10-CM

## 2020-03-01 DIAGNOSIS — O3410 Maternal care for benign tumor of corpus uteri, unspecified trimester: Secondary | ICD-10-CM

## 2020-03-01 DIAGNOSIS — O99820 Streptococcus B carrier state complicating pregnancy: Secondary | ICD-10-CM | POA: Diagnosis not present

## 2020-03-01 DIAGNOSIS — D259 Leiomyoma of uterus, unspecified: Secondary | ICD-10-CM | POA: Diagnosis not present

## 2020-03-01 DIAGNOSIS — O0993 Supervision of high risk pregnancy, unspecified, third trimester: Secondary | ICD-10-CM

## 2020-03-01 DIAGNOSIS — Z3A39 39 weeks gestation of pregnancy: Secondary | ICD-10-CM

## 2020-03-01 NOTE — Patient Instructions (Signed)

## 2020-03-01 NOTE — Addendum Note (Signed)
Addended by: Chancy Milroy on: 03/01/2020 11:57 AM   Modules accepted: Orders, SmartSet

## 2020-03-01 NOTE — Progress Notes (Signed)
Induction Assessment Scheduling Form: Fax to [facility]:  [phone] [address] to [place]                                                                                   DOB:  [birth year]                                                            MRN:  [id number]  Phone:  Home Phone [phone]  Mobile [phone]    Provider:  [facility] (Faculty Practice)  Admission Date/Time:  03/05/20 GP:  G1P0     Gestational age on admission:  72                                                Estimated Date of Delivery: 03/05/20  Dating Criteria: LMP  Filed Weights   03/01/20 1117  Weight: 242 lb (109.8 kg)    GBS: Positive/-- (06/01 1126) HIV:  Non Reactive (05/12 0847)  Reason for induction:  Uterine fibroids Scheduling Provider Signature:  Chancy Milroy, MD      Dilation: Closed Effacement (%): 20 Presentation: Vertex  Method of induction(proposed):  Cytotec   Scheduling Provider Signature:  Chancy Milroy, MD                                            Today's Date:  03/01/2020

## 2020-03-01 NOTE — Progress Notes (Signed)
Subjective:  Lynn Duncan is a 34 y.o. G1P0 at [redacted]w[redacted]d being seen today for ongoing prenatal care.  She is currently monitored for the following issues for this low-risk pregnancy and has Supervision of high risk pregnancy, antepartum; Late prenatal care; Uterine fibroid in pregnancy; Marijuana abuse; and GBS (group B Streptococcus carrier), +RV culture, currently pregnant on their problem list.  Patient reports irregular contractions and general discomforts of pregnancy.  Contractions: Irregular. Vag. Bleeding: None.  Movement: Present. Denies leaking of fluid.   The following portions of the patient's history were reviewed and updated as appropriate: allergies, current medications, past family history, past medical history, past social history, past surgical history and problem list. Problem list updated.  Objective:   Vitals:   03/01/20 1117  BP: 116/85  Pulse: 90  Weight: 242 lb (109.8 kg)    Fetal Status: Fetal Heart Rate (bpm): 137   Movement: Present     General:  Alert, oriented and cooperative. Patient is in no acute distress.  Skin: Skin is warm and dry. No rash noted.   Cardiovascular: Normal heart rate noted  Respiratory: Normal respiratory effort, no problems with respiration noted  Abdomen: Soft, gravid, appropriate for gestational age. Pain/Pressure: Present     Pelvic:  Cervical exam performed        Extremities: Normal range of motion.  Edema: Trace  Mental Status: Normal mood and affect. Normal behavior. Normal judgment and thought content.   Urinalysis:      Assessment and Plan:  Pregnancy: G1P0 at [redacted]w[redacted]d  1. Supervision of high risk pregnancy, antepartum Labor precautons  2. GBS (group B Streptococcus carrier), +RV culture, currently pregnant Tx while in labor  3. Uterine fibroid in pregnancy Stable  Term labor symptoms and general obstetric precautions including but not limited to vaginal bleeding, contractions, leaking of fluid and fetal movement were  reviewed in detail with the patient. Please refer to After Visit Summary for other counseling recommendations.  Return in about 1 week (around 03/08/2020) for face to face, any provider, OB visit.   Chancy Milroy, MD

## 2020-03-02 ENCOUNTER — Other Ambulatory Visit: Payer: Self-pay | Admitting: Advanced Practice Midwife

## 2020-03-02 ENCOUNTER — Telehealth (HOSPITAL_COMMUNITY): Payer: Self-pay | Admitting: *Deleted

## 2020-03-02 NOTE — Telephone Encounter (Signed)
Preadmission screen  

## 2020-03-03 ENCOUNTER — Other Ambulatory Visit (HOSPITAL_COMMUNITY)
Admission: RE | Admit: 2020-03-03 | Discharge: 2020-03-03 | Disposition: A | Discharge status: Home / Self Care | Payer: Medicaid Other | Source: Ambulatory Visit | Attending: Family Medicine | Admitting: Family Medicine

## 2020-03-03 DIAGNOSIS — Z20822 Contact with and (suspected) exposure to covid-19: Secondary | ICD-10-CM | POA: Insufficient documentation

## 2020-03-03 DIAGNOSIS — Z01812 Encounter for preprocedural laboratory examination: Secondary | ICD-10-CM | POA: Insufficient documentation

## 2020-03-03 LAB — SARS CORONAVIRUS 2 (TAT 6-24 HRS): SARS Coronavirus 2: NEGATIVE

## 2020-03-05 ENCOUNTER — Encounter (HOSPITAL_COMMUNITY): Payer: Self-pay | Admitting: Obstetrics and Gynecology

## 2020-03-05 ENCOUNTER — Other Ambulatory Visit: Payer: Self-pay

## 2020-03-05 ENCOUNTER — Inpatient Hospital Stay (HOSPITAL_COMMUNITY): Payer: Medicaid Other | Admitting: Anesthesiology

## 2020-03-05 ENCOUNTER — Inpatient Hospital Stay (HOSPITAL_COMMUNITY): Payer: Medicaid Other

## 2020-03-05 ENCOUNTER — Inpatient Hospital Stay (HOSPITAL_COMMUNITY)
Admission: RE | Admit: 2020-03-05 | Discharge: 2020-03-08 | DRG: 787 | Disposition: A | Discharge status: Home / Self Care | Payer: Medicaid Other | Attending: Obstetrics & Gynecology | Admitting: Obstetrics & Gynecology

## 2020-03-05 DIAGNOSIS — O99824 Streptococcus B carrier state complicating childbirth: Secondary | ICD-10-CM | POA: Diagnosis present

## 2020-03-05 DIAGNOSIS — D259 Leiomyoma of uterus, unspecified: Secondary | ICD-10-CM | POA: Diagnosis present

## 2020-03-05 DIAGNOSIS — O99214 Obesity complicating childbirth: Secondary | ICD-10-CM | POA: Diagnosis present

## 2020-03-05 DIAGNOSIS — O99820 Streptococcus B carrier state complicating pregnancy: Secondary | ICD-10-CM

## 2020-03-05 DIAGNOSIS — O139 Gestational [pregnancy-induced] hypertension without significant proteinuria, unspecified trimester: Secondary | ICD-10-CM

## 2020-03-05 DIAGNOSIS — Z23 Encounter for immunization: Secondary | ICD-10-CM | POA: Diagnosis not present

## 2020-03-05 DIAGNOSIS — O134 Gestational [pregnancy-induced] hypertension without significant proteinuria, complicating childbirth: Secondary | ICD-10-CM | POA: Diagnosis present

## 2020-03-05 DIAGNOSIS — Z30014 Encounter for initial prescription of intrauterine contraceptive device: Secondary | ICD-10-CM | POA: Diagnosis not present

## 2020-03-05 DIAGNOSIS — F17210 Nicotine dependence, cigarettes, uncomplicated: Secondary | ICD-10-CM | POA: Diagnosis present

## 2020-03-05 DIAGNOSIS — Z3A40 40 weeks gestation of pregnancy: Secondary | ICD-10-CM | POA: Diagnosis not present

## 2020-03-05 DIAGNOSIS — O0930 Supervision of pregnancy with insufficient antenatal care, unspecified trimester: Secondary | ICD-10-CM

## 2020-03-05 DIAGNOSIS — Z30430 Encounter for insertion of intrauterine contraceptive device: Secondary | ICD-10-CM

## 2020-03-05 DIAGNOSIS — F1210 Cannabis abuse, uncomplicated: Secondary | ICD-10-CM | POA: Diagnosis present

## 2020-03-05 DIAGNOSIS — O99324 Drug use complicating childbirth: Secondary | ICD-10-CM | POA: Diagnosis present

## 2020-03-05 DIAGNOSIS — O0990 Supervision of high risk pregnancy, unspecified, unspecified trimester: Secondary | ICD-10-CM

## 2020-03-05 DIAGNOSIS — O99334 Smoking (tobacco) complicating childbirth: Secondary | ICD-10-CM | POA: Diagnosis present

## 2020-03-05 DIAGNOSIS — O3413 Maternal care for benign tumor of corpus uteri, third trimester: Secondary | ICD-10-CM | POA: Diagnosis present

## 2020-03-05 DIAGNOSIS — O135 Gestational [pregnancy-induced] hypertension without significant proteinuria, complicating the puerperium: Secondary | ICD-10-CM | POA: Diagnosis not present

## 2020-03-05 LAB — CBC
HCT: 35.7 % — ABNORMAL LOW (ref 36.0–46.0)
Hemoglobin: 11.7 g/dL — ABNORMAL LOW (ref 12.0–15.0)
MCH: 26.8 pg (ref 26.0–34.0)
MCHC: 32.8 g/dL (ref 30.0–36.0)
MCV: 81.7 fL (ref 80.0–100.0)
Platelets: 325 10*3/uL (ref 150–400)
RBC: 4.37 MIL/uL (ref 3.87–5.11)
RDW: 15.9 % — ABNORMAL HIGH (ref 11.5–15.5)
WBC: 10.2 10*3/uL (ref 4.0–10.5)
nRBC: 0 % (ref 0.0–0.2)

## 2020-03-05 LAB — RAPID URINE DRUG SCREEN, HOSP PERFORMED
Amphetamines: NOT DETECTED
Barbiturates: NOT DETECTED
Benzodiazepines: NOT DETECTED
Cocaine: NOT DETECTED
Opiates: NOT DETECTED
Tetrahydrocannabinol: POSITIVE — AB

## 2020-03-05 LAB — TYPE AND SCREEN
ABO/RH(D): B POS
Antibody Screen: NEGATIVE

## 2020-03-05 LAB — RPR: RPR Ser Ql: NONREACTIVE

## 2020-03-05 LAB — ABO/RH: ABO/RH(D): B POS

## 2020-03-05 MED ORDER — OXYTOCIN BOLUS FROM INFUSION: 333.0000 mL | Freq: Once | INTRAVENOUS | Status: DC

## 2020-03-05 MED ORDER — MISOPROSTOL 25 MCG QUARTER TABLET
25.0000 ug | ORAL_TABLET | ORAL | Status: DC | PRN
  Administered 2020-03-05: 25 ug via VAGINAL
  Filled 2020-03-05: qty 1

## 2020-03-05 MED ORDER — TERBUTALINE SULFATE 1 MG/ML IJ SOLN
0.2500 mg | Freq: Once | INTRAMUSCULAR | Status: AC | PRN
  Administered 2020-03-06: 0.25 mg via SUBCUTANEOUS
  Filled 2020-03-05: qty 1

## 2020-03-05 MED ORDER — LIDOCAINE HCL (PF) 1 % IJ SOLN
INTRAMUSCULAR | Status: DC | PRN
  Administered 2020-03-05: 6 mL via EPIDURAL

## 2020-03-05 MED ORDER — FENTANYL CITRATE (PF) 100 MCG/2ML IJ SOLN
100.0000 ug | Freq: Once | INTRAMUSCULAR | Status: AC
  Administered 2020-03-05: 100 ug via INTRAVENOUS
  Filled 2020-03-05: qty 2

## 2020-03-05 MED ORDER — OXYTOCIN-SODIUM CHLORIDE 30-0.9 UT/500ML-% IV SOLN
1.0000 m[IU]/min | INTRAVENOUS | Status: DC
  Administered 2020-03-05: 2 m[IU]/min via INTRAVENOUS
  Filled 2020-03-05: qty 500

## 2020-03-05 MED ORDER — LIDOCAINE HCL (PF) 1 % IJ SOLN: 30.0000 mL | INTRAMUSCULAR | Status: DC | PRN

## 2020-03-05 MED ORDER — OXYCODONE-ACETAMINOPHEN 5-325 MG PO TABS: 1.0000 | ORAL_TABLET | ORAL | Status: DC | PRN

## 2020-03-05 MED ORDER — MISOPROSTOL 50MCG HALF TABLET: 50.0000 ug | ORAL_TABLET | ORAL | Status: DC | PRN

## 2020-03-05 MED ORDER — LACTATED RINGERS IV SOLN
500.0000 mL | INTRAVENOUS | Status: DC | PRN
  Administered 2020-03-05: 1000 mL via INTRAVENOUS

## 2020-03-05 MED ORDER — ONDANSETRON HCL 4 MG/2ML IJ SOLN
4.0000 mg | Freq: Four times a day (QID) | INTRAMUSCULAR | Status: DC | PRN
  Administered 2020-03-05: 4 mg via INTRAVENOUS
  Filled 2020-03-05: qty 2

## 2020-03-05 MED ORDER — FENTANYL CITRATE (PF) 100 MCG/2ML IJ SOLN
50.0000 ug | Freq: Once | INTRAMUSCULAR | Status: AC
  Administered 2020-03-05: 50 ug via INTRAVENOUS

## 2020-03-05 MED ORDER — FENTANYL-BUPIVACAINE-NACL 0.5-0.125-0.9 MG/250ML-% EP SOLN: 12.0000 mL/h | EPIDURAL | Status: DC | PRN

## 2020-03-05 MED ORDER — PHENYLEPHRINE 40 MCG/ML (10ML) SYRINGE FOR IV PUSH (FOR BLOOD PRESSURE SUPPORT)
80.0000 ug | PREFILLED_SYRINGE | INTRAVENOUS | Status: DC | PRN
  Administered 2020-03-06 (×2): 80 ug via INTRAVENOUS

## 2020-03-05 MED ORDER — FENTANYL CITRATE (PF) 100 MCG/2ML IJ SOLN
50.0000 ug | INTRAMUSCULAR | Status: DC | PRN
  Filled 2020-03-05: qty 2

## 2020-03-05 MED ORDER — EPHEDRINE 5 MG/ML INJ: 10.0000 mg | INTRAVENOUS | Status: DC | PRN

## 2020-03-05 MED ORDER — SODIUM CHLORIDE 0.9 % IV SOLN
5.0000 10*6.[IU] | Freq: Once | INTRAVENOUS | Status: AC
  Administered 2020-03-05: 5 10*6.[IU] via INTRAVENOUS
  Filled 2020-03-05: qty 5

## 2020-03-05 MED ORDER — DIPHENHYDRAMINE HCL 50 MG/ML IJ SOLN
12.5000 mg | INTRAMUSCULAR | Status: DC | PRN
  Administered 2020-03-06: 12.5 mg via INTRAVENOUS

## 2020-03-05 MED ORDER — OXYTOCIN-SODIUM CHLORIDE 30-0.9 UT/500ML-% IV SOLN
2.5000 [IU]/h | INTRAVENOUS | Status: DC
  Administered 2020-03-06 (×2): 30 [IU]/h via INTRAVENOUS

## 2020-03-05 MED ORDER — OXYCODONE-ACETAMINOPHEN 5-325 MG PO TABS: 2.0000 | ORAL_TABLET | ORAL | Status: DC | PRN

## 2020-03-05 MED ORDER — DIPHENHYDRAMINE HCL 50 MG/ML IJ SOLN: 12.5000 mg | INTRAMUSCULAR | Status: DC | PRN

## 2020-03-05 MED ORDER — PENICILLIN G POT IN DEXTROSE 60000 UNIT/ML IV SOLN
3.0000 10*6.[IU] | INTRAVENOUS | Status: DC
  Administered 2020-03-05 – 2020-03-06 (×5): 3 10*6.[IU] via INTRAVENOUS
  Filled 2020-03-05 (×5): qty 50

## 2020-03-05 MED ORDER — SODIUM CHLORIDE (PF) 0.9 % IJ SOLN
INTRAMUSCULAR | Status: DC | PRN
  Administered 2020-03-05: 14 mL/h via EPIDURAL

## 2020-03-05 MED ORDER — LACTATED RINGERS IV SOLN
500.0000 mL | Freq: Once | INTRAVENOUS | Status: AC
  Administered 2020-03-05: 500 mL via INTRAVENOUS

## 2020-03-05 MED ORDER — FENTANYL CITRATE (PF) 100 MCG/2ML IJ SOLN
100.0000 ug | INTRAMUSCULAR | Status: DC | PRN
  Administered 2020-03-05: 100 ug via INTRAVENOUS
  Filled 2020-03-05: qty 2

## 2020-03-05 MED ORDER — LACTATED RINGERS IV SOLN: INTRAVENOUS | Status: DC

## 2020-03-05 MED ORDER — MISOPROSTOL 50MCG HALF TABLET
50.0000 ug | ORAL_TABLET | ORAL | Status: DC | PRN
  Administered 2020-03-05 (×2): 50 ug via BUCCAL
  Filled 2020-03-05 (×2): qty 1

## 2020-03-05 MED ORDER — PHENYLEPHRINE 40 MCG/ML (10ML) SYRINGE FOR IV PUSH (FOR BLOOD PRESSURE SUPPORT)
80.0000 ug | PREFILLED_SYRINGE | INTRAVENOUS | Status: DC | PRN
  Filled 2020-03-05: qty 10

## 2020-03-05 MED ORDER — LEVONORGESTREL 19.5 MCG/DAY IU IUD
INTRAUTERINE_SYSTEM | Freq: Once | INTRAUTERINE | Status: AC
  Administered 2020-03-06: 1 via INTRAUTERINE
  Filled 2020-03-05: qty 1

## 2020-03-05 MED ORDER — SOD CITRATE-CITRIC ACID 500-334 MG/5ML PO SOLN
30.0000 mL | ORAL | Status: DC | PRN
  Administered 2020-03-06: 30 mL via ORAL
  Filled 2020-03-05: qty 30

## 2020-03-05 MED ORDER — FENTANYL-BUPIVACAINE-NACL 0.5-0.125-0.9 MG/250ML-% EP SOLN
12.0000 mL/h | EPIDURAL | Status: DC | PRN
  Filled 2020-03-05: qty 250

## 2020-03-05 MED ORDER — PROMETHAZINE HCL 25 MG/ML IJ SOLN
25.0000 mg | Freq: Once | INTRAMUSCULAR | Status: AC
  Administered 2020-03-05: 25 mg via INTRAVENOUS
  Filled 2020-03-05: qty 1

## 2020-03-05 MED ORDER — ACETAMINOPHEN 325 MG PO TABS: 650.0000 mg | ORAL_TABLET | ORAL | Status: DC | PRN

## 2020-03-05 NOTE — Progress Notes (Signed)
LABOR PROGRESS NOTE  Lynn Duncan is a 34 y.o. G1P0 at [redacted]w[redacted]d  admitted for IOL due to uterine fibriods.  Subjective: Comfortable with epidural.   Objective: BP (!) 109/53   Pulse 65   Temp 97.8 F (36.6 C) (Oral)   Resp 15   Ht 5\' 4"  (1.626 m)   Wt 110.3 kg   LMP 05/30/2019   SpO2 99%   BMI 41.73 kg/m  or  Vitals:   03/05/20 2140 03/05/20 2203 03/05/20 2204 03/05/20 2206  BP:  (!) 32/11 (!) 87/50 (!) 109/53  Pulse:  (!) 203 (!) 252 65  Resp:    15  Temp:      TempSrc:      SpO2: 99%     Weight:      Height:        Dilation: 4.5 Effacement (%): 60 Station: -2 Presentation: Vertex Exam by:: Consuela Mimes, RN FHT: baseline rate 140, moderate varibility, no acel, no decel (did have one prolonged after epidural) Toco: difficult to trace currently   Labs: Lab Results  Component Value Date   WBC 10.2 03/05/2020   HGB 11.7 (L) 03/05/2020   HCT 35.7 (L) 03/05/2020   MCV 81.7 03/05/2020   PLT 325 03/05/2020    Patient Active Problem List   Diagnosis Date Noted  . IUP (intrauterine pregnancy), incidental 03/05/2020  . GBS (group B Streptococcus carrier), +RV culture, currently pregnant 02/19/2020  . Uterine fibroid in pregnancy 01/19/2020  . Marijuana abuse 01/19/2020  . Late prenatal care 01/14/2020  . Supervision of high risk pregnancy, antepartum 01/04/2020    Assessment / Plan: 34 y.o. G1P0 at [redacted]w[redacted]d here for IOL due to uterine fibroids.  Labor: s/p 3 doses of cytotec and FB. Will start Pitocin. AROM PRN. Anticipate SVD.  Fetal Wellbeing:  Cat I, low threshold for phenylephrine or ephedrine with any more decels as BP's lower than baseline and likely etiology s/p epidural  Pain Control:  Epidural  GBS: positive, adequate ppx with IV PCN   Barrington Ellison, MD OB Family Medicine Fellow, [facility], [facility] 03/05/2020, 10:27 PM

## 2020-03-05 NOTE — Progress Notes (Signed)
F&m monitor not tracing continuously.  Switched to [employer]
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
## 2020-03-05 NOTE — Progress Notes (Signed)
LABOR PROGRESS NOTE  Lynn Duncan is a 34 y.o. G1P0 at [redacted]w[redacted]d  admitted for IOL due to uterine fibriods.  Subjective: Patient reporting frequent pain, 10/10 in severity right at one hour mark after pain medications. Discussed pain management with nursing.   Objective: BP 110/62   Pulse 83   Temp 98.1 F (36.7 C) (Oral)   Resp 16   Ht 5\' 4"  (1.626 m)   Wt 110.3 kg   LMP 05/30/2019   BMI 41.73 kg/m  or  Vitals:   03/05/20 1555 03/05/20 1726 03/05/20 1810 03/05/20 1851  BP: 93/67 (!) 137/95 127/90 110/62  Pulse: 77 89 69 83  Resp: 20 18 20 16   Temp: 98.1 F (36.7 C)     TempSrc: Oral     Weight:      Height:        Dilation: 1 Effacement (%): Thick, 60 Station: Ballotable Exam by:: Dr Rosita Fire FHT: baseline rate 130, moderate varibility, no acel, no decel Toco: every 5-6 minutes   Labs: Lab Results  Component Value Date   WBC 10.2 03/05/2020   HGB 11.7 (L) 03/05/2020   HCT 35.7 (L) 03/05/2020   MCV 81.7 03/05/2020   PLT 325 03/05/2020    Patient Active Problem List   Diagnosis Date Noted  . IUP (intrauterine pregnancy), incidental 03/05/2020  . GBS (group B Streptococcus carrier), +RV culture, currently pregnant 02/19/2020  . Uterine fibroid in pregnancy 01/19/2020  . Marijuana abuse 01/19/2020  . Late prenatal care 01/14/2020  . Supervision of high risk pregnancy, antepartum 01/04/2020    Assessment / Plan: 34 y.o. G1P0 at [redacted]w[redacted]d here for IOL due to uterine fibroids.  Labor: s/p 3 doses of cytotec, FB in place since 1715  Fetal Wellbeing:  Category I  Pain Control:  IV fentanyl q 1 hr PRN, patient planning for epidural  GBS: positive, adequate ppx with IV PCN  Anticipated MOD:  Vaginal delivery    Eulis Foster, MD Family Medicine, PGY-1  03/05/2020, 7:12 PM

## 2020-03-05 NOTE — Progress Notes (Signed)
Telemetry monitors removed and f&m monitors applied after skin prepped

## 2020-03-05 NOTE — Anesthesia Preprocedure Evaluation (Signed)
Anesthesia Evaluation  Patient identified by MRN, date of birth, ID band Patient awake    Reviewed: Allergy & Precautions, H&P , NPO status , Patient's Chart, lab work & pertinent test results, reviewed documented beta blocker date and time   Airway Mallampati: I  TM Distance: >3 FB Neck ROM: full    Dental no notable dental hx. (+) Teeth Intact, Dental Advisory Given   Pulmonary neg pulmonary ROS, Current Smoker,    Pulmonary exam normal breath sounds clear to auscultation       Cardiovascular negative cardio ROS Normal cardiovascular exam Rhythm:regular Rate:Normal     Neuro/Psych negative neurological ROS  negative psych ROS   GI/Hepatic negative GI ROS, (+)     substance abuse  marijuana use,   Endo/Other  Morbid obesity  Renal/GU negative Renal ROS  negative genitourinary   Musculoskeletal   Abdominal (+) + obese,   Peds  Hematology negative hematology ROS (+)   Anesthesia Other Findings   Reproductive/Obstetrics (+) Pregnancy                             Anesthesia Physical Anesthesia Plan  ASA: III  Anesthesia Plan: Epidural   Post-op Pain Management:    Induction:   PONV Risk Score and Plan:   Airway Management Planned:   Additional Equipment:   Intra-op Plan:   Post-operative Plan:   Informed Consent: I have reviewed the patients History and Physical, chart, labs and discussed the procedure including the risks, benefits and alternatives for the proposed anesthesia with the patient or authorized representative who has indicated his/her understanding and acceptance.     Dental Advisory Given  Plan Discussed with:   Anesthesia Plan Comments: (Labs checked- platelets confirmed with RN in room. Fetal heart tracing, per RN, reported to be stable enough for sitting procedure. Discussed epidural, and patient consents to the procedure:  included risk of possible  headache,backache, failed block, allergic reaction, and nerve injury. This patient was asked if she had any questions or concerns before the procedure started.)        Anesthesia Quick Evaluation

## 2020-03-05 NOTE — Progress Notes (Signed)
LABOR PROGRESS NOTE  Lynn Duncan is a 34 y.o. G1P0 at [redacted]w[redacted]d  admitted for IOL 2/2 to uterine fibroids.  Subjective: Patient is comfortably resting in bed with no new complaints.   Objective: BP 136/88   Pulse 70   Temp 97.6 F (36.4 C) (Oral)   Resp 20   Ht 5\' 4"  (1.626 m)   Wt 110.3 kg   LMP 05/30/2019   BMI 41.73 kg/m  or  Vitals:   03/05/20 1004 03/05/20 1106 03/05/20 1139 03/05/20 1202  BP: 127/68 134/82  136/88  Pulse: 70 77  70  Resp: 17 20 18 20   Temp:    97.6 F (36.4 C)  TempSrc:    Oral  Weight:      Height:        Dilation: Closed Effacement (%): Thick Station: -2 FHT: baseline rate 130, moderate varibility, + 15x15acel, no decel Toco: irregular  Labs: Lab Results  Component Value Date   WBC 10.2 03/05/2020   HGB 11.7 (L) 03/05/2020   HCT 35.7 (L) 03/05/2020   MCV 81.7 03/05/2020   PLT 325 03/05/2020    Patient Active Problem List   Diagnosis Date Noted  . IUP (intrauterine pregnancy), incidental 03/05/2020  . GBS (group B Streptococcus carrier), +RV culture, currently pregnant 02/19/2020  . Uterine fibroid in pregnancy 01/19/2020  . Marijuana abuse 01/19/2020  . Late prenatal care 01/14/2020  . Supervision of high risk pregnancy, antepartum 01/04/2020    Assessment / Plan: 34 y.o. G1P0 at [redacted]w[redacted]d here for IOL 2/2 to uterine fibroids.  Labor: cervical ripening, s/p 1 dose buccal cytotec, will place second dose of cytotec vaginally, will likely place FB once patient has made enough cervical change followed by pitocin  Fetal Wellbeing:  Cat I  Pain Control:  Planning for epidural  GBS: +, PCN started at 0803, 1148 Anticipated MOD:  Vaginal delivery  Hx of THC: +THC on admission, will consult TOC PP   Eulis Foster, MD Family Medicine, PGY-1  03/05/2020, 12:37 PM

## 2020-03-05 NOTE — Progress Notes (Signed)
Lynn Duncan is a 34 y.o. G1P0 at [redacted]w[redacted]d   Subjective: Resting comfortably in bed, reporting intermittent cramping. Partner Tellius at bedside and engaged in care.  Objective: BP 93/67   Pulse 77   Temp 98.1 F (36.7 C) (Oral)   Resp 20   Ht 5\' 4"  (1.626 m)   Wt 110.3 kg   LMP 05/30/2019   BMI 41.73 kg/m  No intake/output data recorded. No intake/output data recorded.  FHT:  FHR: 135 bpm, variability: moderate,  accelerations:  Present,  decelerations:  Absent UC:  Irregular SVE:   Dilation: Closed Effacement (%): Thick Station: -2 Exam by:: Dr Rosita Fire  Labs: Lab Results  Component Value Date   WBC 10.2 03/05/2020   HGB 11.7 (L) 03/05/2020   HCT 35.7 (L) 03/05/2020   MCV 81.7 03/05/2020   PLT 325 03/05/2020    Assessment / Plan: --Cat I tracing --Normotensive --GBS +, PCN initiated at 0803 --S/p Cytotec x 2 --Foley balloon inserted, tolerated well by patient --Place  50 mcg Cytotec BU when able --Order placed for one-time Fentanyl 50 mcg IV for cramping with FB placement --Encouraged peanut ball and position changes --Anticipate NSVD  Darlina Rumpf, CNM 03/05/2020, 5:18 PM

## 2020-03-05 NOTE — Anesthesia Procedure Notes (Signed)
Epidural Patient location during procedure: OB Start time: 03/05/2020 8:57 PM End time: 03/05/2020 9:02 PM  Staffing Anesthesiologist: [name], MD  Preanesthetic Checklist Completed: patient identified, IV checked, site marked, risks and benefits discussed, surgical consent, monitors and equipment checked, pre-op evaluation and timeout performed  Epidural Patient position: sitting Prep: DuraPrep and site prepped and draped Patient monitoring: continuous pulse ox and blood pressure Approach: midline Location: L2-L3 Injection technique: LOR air  Needle:  Needle type: Tuohy  Needle gauge: 17 G Needle length: 9 cm and 9 Needle insertion depth: 9 cm Catheter type: closed end flexible Catheter size: 19 Gauge Catheter at skin depth: 14 cm Test dose: negative  Assessment Events: blood not aspirated, injection not painful, no injection resistance, no paresthesia and negative IV test

## 2020-03-05 NOTE — H&P (Addendum)
OBSTETRIC ADMISSION HISTORY AND PHYSICAL  Lynn Duncan is a 34 y.o. female G1P0 with IUP at [redacted]w[redacted]d by [facility] presenting for IOL 2/2 to uterine fibroids. She reports +FMs, No LOF, no VB, no blurry vision, headaches or peripheral edema, and RUQ pain.  She plans on breast feeding. She requests postplacental IUD for birth control. She received her prenatal care at [place]: By [facility] --->  Estimated Date of Delivery: 03/05/20  Sono:   @[redacted]w[redacted]d , CWD, normal anatomy, cephalic presentation, posterior placenta, 2681g, 68% EFW  Prenatal History/Complications: -Low lying placenta, resolved -+ test for trichomonas, negative TOC  -Uterine Fibroid during pregnancy  -Hx of marijuana use   Past Medical History: Past Medical History:  Diagnosis Date  . History of hepatitis C    negative RNA may 2021  . Medical history non-contributory     Past Surgical History: Past Surgical History:  Procedure Laterality Date  . TONSILLECTOMY      Obstetrical History: OB History    Gravida  1   Para      Term      Preterm      AB      Living        SAB      TAB      Ectopic      Multiple      Live Births              Social History: Social History   Socioeconomic History  . Marital status: Single    Spouse name: Not on file  . Number of children: Not on file  . Years of education: Not on file  . Highest education level: Not on file  Occupational History  . Not on file  Tobacco Use  . Smoking status: Light Tobacco Smoker  . Smokeless tobacco: Never Used  . Tobacco comment: 3 cigs this month  Vaping Use  . Vaping Use: Never used  Substance and Sexual Activity  . Alcohol use: Never  . Drug use: Never  . Sexual activity: Yes    Birth control/protection: None  Other Topics Concern  . Not on file  Social History Narrative  . Not on file   Social Determinants of Health   Financial Resource Strain:   . Difficulty of Paying Living Expenses:   Food Insecurity: No Food  Insecurity  . Worried About Charity fundraiser in the Last Year: Never true  . Ran Out of Food in the Last Year: Never true  Transportation Needs: No Transportation Needs  . Lack of Transportation (Medical): No  . Lack of Transportation (Non-Medical): No  Physical Activity:   . Days of Exercise per Week:   . Minutes of Exercise per Session:   Stress:   . Feeling of Stress :   Social Connections:   . Frequency of Communication with Friends and Family:   . Frequency of Social Gatherings with Friends and Family:   . Attends Religious Services:   . Active Member of Clubs or Organizations:   . Attends Archivist Meetings:   Marland Kitchen Marital Status:     Family History: No family history on file.  Allergies: No Known Allergies  Medications Prior to Admission  Medication Sig Dispense Refill Last Dose  . acetaminophen (TYLENOL) 325 MG tablet Take 650 mg by mouth every 6 (six) hours as needed for mild pain or headache.     . famotidine (PEPCID) 20 MG tablet Take 1 tablet (20 mg total)  by mouth 2 (two) times daily. (Patient not taking: Reported on 02/01/2020) 60 tablet 1   . ondansetron (ZOFRAN ODT) 4 MG disintegrating tablet Take 1 tablet (4 mg total) by mouth every 8 (eight) hours as needed for nausea or vomiting. 20 tablet 0   . Prenatal Vit-Fe Fumarate-FA (PRENATAL MULTIVITAMIN) TABS tablet Take 1 tablet by mouth daily at 12 noon.     . promethazine (PHENERGAN) 25 MG tablet Take 1 tablet (25 mg total) by mouth every 6 (six) hours as needed for nausea or vomiting. (Patient not taking: Reported on 02/01/2020) 30 tablet 0      Review of Systems   All systems reviewed and negative except as stated in HPI  Blood pressure 131/78, pulse 78, temperature 97.7 F (36.5 C), temperature source Oral, resp. rate 16, height 5\' 4"  (1.626 m), weight 110.3 kg, last menstrual period 05/30/2019, unknown if currently breastfeeding. General appearance: alert, cooperative, appears stated age and no  distress Lungs: normal effort Heart: regular rate  Abdomen: soft, non-tender; bowel sounds normal Pelvic: gravid uterus Extremities: Homans sign is negative, no sign of DVT Presentation: cephalic Fetal monitoringBaseline: 130 bpm, Variability: Fair (1-6 bpm), Accelerations: no accelerations  and Decelerations: Absent Uterine activity: every 2-4 mins,  Dilation: Closed Effacement (%): Thick Station: -2   Prenatal labs: ABO, Rh: --/--/B POS (06/26 6073) Antibody: NEG (06/26 0748) Rubella: 1.03 (05/06 1012) RPR: Non Reactive (05/12 0847)  HBsAg: Negative (05/06 1012)  HIV: Non Reactive (05/12 0847)  GBS: Positive/-- (06/01 1126)  2 hr Glucola normal  Genetic screening normal  Anatomy US normal   Prenatal Transfer Tool  Maternal Diabetes: No Genetic Screening: Normal Maternal Ultrasounds/Referrals: Normal, uterine fibroids  Fetal Ultrasounds or other Referrals:  None Maternal Substance Abuse:  Yes:  Type: Marijuana Significant Maternal Medications:  None Significant Maternal Lab Results: Group B Strep positive  Results for orders placed or performed during the hospital encounter of 03/05/20 (from the past 24 hour(s))  CBC   Collection Time: 03/05/20  7:48 AM  Result Value Ref Range   WBC 10.2 4.0 - 10.5 K/uL   RBC 4.37 3.87 - 5.11 MIL/uL   Hemoglobin 11.7 (L) 12.0 - 15.0 g/dL   HCT 35.7 (L) 36 - 46 %   MCV 81.7 80.0 - 100.0 fL   MCH 26.8 26.0 - 34.0 pg   MCHC 32.8 30.0 - 36.0 g/dL   RDW 15.9 (H) 11.5 - 15.5 %   Platelets 325 150 - 400 K/uL   nRBC 0.0 0.0 - 0.2 %  Type and screen   Collection Time: 03/05/20  7:48 AM  Result Value Ref Range   ABO/RH(D) B POS    Antibody Screen NEG    Sample Expiration      03/08/2020,2359 Performed at [facility], [address] [place], [place] [address]     Patient Active Problem List   Diagnosis Date Noted  . IUP (intrauterine pregnancy), incidental 03/05/2020  . GBS (group B Streptococcus carrier), +RV culture,  currently pregnant 02/19/2020  . Uterine fibroid in pregnancy 01/19/2020  . Marijuana abuse 01/19/2020  . Late prenatal care 01/14/2020  . Supervision of high risk pregnancy, antepartum 01/04/2020    Assessment/Plan:  Lynn Duncan is a 34 y.o. G1P0 at [redacted]w[redacted]d here for IOL due to uterine fibroids.   #Labor: admit to L&D, IOL with cytotec, likely followed by foley bulb and pitocin, anticipate vaginal delivery #Pain: Epidural  #FWB: Category I ; # EFW: 3000g #ID:  GBS  positive, PCN ordered  #MOF: breast #MOC: IUD post placenta  #Circ:  yes #Hx of Marijuana Use: UDS   Eulis Foster, MD Family Medicine, PGY-1 03/05/2020, 9:04 AM   Attestation of Supervision of Student:  I confirm that I have verified the information documented in the resident's note and that I have also personally reperformed the history, physical exam and all medical decision making activities.  I have verified that all services and findings are accurately documented in this student's note; and I agree with management and plan as outlined in the documentation. I have also made any necessary editorial changes.  --Rare 15 x 15 accels as of 0932. RN asked to provide juice, reposition, IV fluid bolus PRN --Vertex positioned confirmed with BSUS before placement of Cytotec --Reevaluate for foley bulb placement in 4 hours  --Anticipate NSVD  Postpartum Planning --Rh + --Postplacental Liletta ordered --Inpatient SW consult for + UDS in pregnancy, inpatient UDS ordered  Darlina Rumpf, [place] for [employer], [place] 03/05/2020 9:32 AM

## 2020-03-06 ENCOUNTER — Encounter (HOSPITAL_COMMUNITY): Payer: Self-pay | Admitting: Obstetrics & Gynecology

## 2020-03-06 ENCOUNTER — Encounter (HOSPITAL_COMMUNITY)
Admission: RE | Disposition: A | Discharge status: Home / Self Care | Payer: Self-pay | Source: Home / Self Care | Attending: Obstetrics & Gynecology

## 2020-03-06 DIAGNOSIS — O99824 Streptococcus B carrier state complicating childbirth: Secondary | ICD-10-CM

## 2020-03-06 DIAGNOSIS — O135 Gestational [pregnancy-induced] hypertension without significant proteinuria, complicating the puerperium: Secondary | ICD-10-CM

## 2020-03-06 DIAGNOSIS — Z30014 Encounter for initial prescription of intrauterine contraceptive device: Secondary | ICD-10-CM

## 2020-03-06 DIAGNOSIS — Z3A40 40 weeks gestation of pregnancy: Secondary | ICD-10-CM

## 2020-03-06 SURGERY — Surgical Case
Anesthesia: Epidural | Site: Abdomen | Wound class: Clean Contaminated

## 2020-03-06 MED ORDER — TETANUS-DIPHTH-ACELL PERTUSSIS 5-2.5-18.5 LF-MCG/0.5 IM SUSP
0.5000 mL | Freq: Once | INTRAMUSCULAR | Status: AC
  Administered 2020-03-07: 0.5 mL via INTRAMUSCULAR
  Filled 2020-03-06: qty 0.5

## 2020-03-06 MED ORDER — LACTATED RINGERS IV SOLN: INTRAVENOUS | Status: DC

## 2020-03-06 MED ORDER — DIPHENHYDRAMINE HCL 25 MG PO CAPS
25.0000 mg | ORAL_CAPSULE | ORAL | Status: DC | PRN
  Administered 2020-03-06: 25 mg via ORAL
  Filled 2020-03-06 (×2): qty 1

## 2020-03-06 MED ORDER — DIPHENHYDRAMINE HCL 25 MG PO CAPS
25.0000 mg | ORAL_CAPSULE | Freq: Four times a day (QID) | ORAL | Status: DC | PRN
  Administered 2020-03-06: 25 mg via ORAL

## 2020-03-06 MED ORDER — TRAMADOL HCL 50 MG PO TABS
50.0000 mg | ORAL_TABLET | Freq: Four times a day (QID) | ORAL | Status: DC | PRN
  Administered 2020-03-07 – 2020-03-08 (×2): 50 mg via ORAL
  Filled 2020-03-06 (×2): qty 1

## 2020-03-06 MED ORDER — PROPOFOL 10 MG/ML IV BOLUS
INTRAVENOUS | Status: AC
  Filled 2020-03-06: qty 20

## 2020-03-06 MED ORDER — PHENYLEPHRINE HCL (PRESSORS) 10 MG/ML IV SOLN
INTRAVENOUS | Status: DC | PRN
  Administered 2020-03-06 (×2): 80 ug via INTRAVENOUS
  Administered 2020-03-06: 40 ug via INTRAVENOUS
  Administered 2020-03-06: 50 ug via INTRAVENOUS
  Administered 2020-03-06: 80 ug via INTRAVENOUS
  Administered 2020-03-06 (×2): 40 ug via INTRAVENOUS

## 2020-03-06 MED ORDER — NALBUPHINE HCL 10 MG/ML IJ SOLN: 5.0000 mg | INTRAMUSCULAR | Status: DC | PRN

## 2020-03-06 MED ORDER — DIBUCAINE (PERIANAL) 1 % EX OINT: 1.0000 "application " | TOPICAL_OINTMENT | CUTANEOUS | Status: DC | PRN

## 2020-03-06 MED ORDER — IBUPROFEN 800 MG PO TABS
800.0000 mg | ORAL_TABLET | Freq: Four times a day (QID) | ORAL | Status: DC
  Administered 2020-03-07 – 2020-03-08 (×5): 800 mg via ORAL
  Filled 2020-03-06 (×5): qty 1

## 2020-03-06 MED ORDER — SIMETHICONE 80 MG PO CHEW: 80.0000 mg | CHEWABLE_TABLET | ORAL | Status: DC | PRN

## 2020-03-06 MED ORDER — MEPERIDINE HCL 25 MG/ML IJ SOLN
INTRAMUSCULAR | Status: DC | PRN
  Administered 2020-03-06 (×2): 12.5 mg via INTRAVENOUS

## 2020-03-06 MED ORDER — LIDOCAINE-EPINEPHRINE (PF) 2 %-1:200000 IJ SOLN
INTRAMUSCULAR | Status: AC
  Filled 2020-03-06: qty 20

## 2020-03-06 MED ORDER — SODIUM CHLORIDE 0.9 % IV SOLN
2.0000 g | INTRAVENOUS | Status: AC
  Administered 2020-03-06: 2 g via INTRAVENOUS
  Filled 2020-03-06: qty 2

## 2020-03-06 MED ORDER — PROPOFOL 10 MG/ML IV BOLUS
INTRAVENOUS | Status: DC | PRN
  Administered 2020-03-06 (×2): 10 mg via INTRAVENOUS

## 2020-03-06 MED ORDER — SODIUM CHLORIDE 0.9 % IR SOLN
Status: DC | PRN
  Administered 2020-03-06: 1000 mL

## 2020-03-06 MED ORDER — OXYTOCIN-SODIUM CHLORIDE 30-0.9 UT/500ML-% IV SOLN
INTRAVENOUS | Status: AC
  Filled 2020-03-06: qty 500

## 2020-03-06 MED ORDER — ZOLPIDEM TARTRATE 5 MG PO TABS: 5.0000 mg | ORAL_TABLET | Freq: Every evening | ORAL | Status: DC | PRN

## 2020-03-06 MED ORDER — DEXAMETHASONE SODIUM PHOSPHATE 4 MG/ML IJ SOLN
INTRAMUSCULAR | Status: DC | PRN
  Administered 2020-03-06: 8 mg via INTRAVENOUS

## 2020-03-06 MED ORDER — NALBUPHINE HCL 10 MG/ML IJ SOLN: 5.0000 mg | Freq: Once | INTRAMUSCULAR | Status: DC | PRN

## 2020-03-06 MED ORDER — METOCLOPRAMIDE HCL 5 MG/ML IJ SOLN
INTRAMUSCULAR | Status: AC
  Filled 2020-03-06: qty 2

## 2020-03-06 MED ORDER — FENTANYL CITRATE (PF) 100 MCG/2ML IJ SOLN
INTRAMUSCULAR | Status: AC
  Filled 2020-03-06: qty 2

## 2020-03-06 MED ORDER — GABAPENTIN 100 MG PO CAPS
300.0000 mg | ORAL_CAPSULE | Freq: Two times a day (BID) | ORAL | Status: DC
  Administered 2020-03-06 – 2020-03-08 (×4): 300 mg via ORAL
  Filled 2020-03-06 (×5): qty 3

## 2020-03-06 MED ORDER — PRENATAL MULTIVITAMIN CH
1.0000 | ORAL_TABLET | Freq: Every day | ORAL | Status: DC
  Administered 2020-03-06 – 2020-03-07 (×2): 1 via ORAL
  Filled 2020-03-06 (×2): qty 1

## 2020-03-06 MED ORDER — METHYLERGONOVINE MALEATE 0.2 MG/ML IJ SOLN
INTRAMUSCULAR | Status: DC | PRN
  Administered 2020-03-06: .2 mg via INTRAMUSCULAR

## 2020-03-06 MED ORDER — OXYCODONE HCL 5 MG PO TABS: 5.0000 mg | ORAL_TABLET | Freq: Once | ORAL | Status: DC | PRN

## 2020-03-06 MED ORDER — ONDANSETRON HCL 4 MG/2ML IJ SOLN: 4.0000 mg | Freq: Three times a day (TID) | INTRAMUSCULAR | Status: DC | PRN

## 2020-03-06 MED ORDER — KETOROLAC TROMETHAMINE 30 MG/ML IJ SOLN: 30.0000 mg | Freq: Once | INTRAMUSCULAR | Status: DC | PRN

## 2020-03-06 MED ORDER — ENOXAPARIN SODIUM 60 MG/0.6ML ~~LOC~~ SOLN
0.5000 mg/kg | SUBCUTANEOUS | Status: DC
  Administered 2020-03-07 – 2020-03-08 (×2): 55 mg via SUBCUTANEOUS
  Filled 2020-03-06 (×2): qty 0.6

## 2020-03-06 MED ORDER — MEPERIDINE HCL 25 MG/ML IJ SOLN: 6.2500 mg | INTRAMUSCULAR | Status: DC | PRN

## 2020-03-06 MED ORDER — KETOROLAC TROMETHAMINE 30 MG/ML IJ SOLN
30.0000 mg | Freq: Four times a day (QID) | INTRAMUSCULAR | Status: AC
  Administered 2020-03-06: 30 mg via INTRAVENOUS
  Filled 2020-03-06 (×2): qty 1

## 2020-03-06 MED ORDER — NALOXONE HCL 0.4 MG/ML IJ SOLN: 0.4000 mg | INTRAMUSCULAR | Status: DC | PRN

## 2020-03-06 MED ORDER — OXYCODONE HCL 5 MG/5ML PO SOLN: 5.0000 mg | Freq: Once | ORAL | Status: DC | PRN

## 2020-03-06 MED ORDER — OXYTOCIN-SODIUM CHLORIDE 30-0.9 UT/500ML-% IV SOLN: 2.5000 [IU]/h | INTRAVENOUS | Status: AC

## 2020-03-06 MED ORDER — KETOROLAC TROMETHAMINE 30 MG/ML IJ SOLN
INTRAMUSCULAR | Status: AC
  Filled 2020-03-06: qty 1

## 2020-03-06 MED ORDER — HYDROMORPHONE HCL 1 MG/ML IJ SOLN: 1.0000 mg | INTRAMUSCULAR | Status: DC | PRN

## 2020-03-06 MED ORDER — SIMETHICONE 80 MG PO CHEW
80.0000 mg | CHEWABLE_TABLET | ORAL | Status: DC
  Administered 2020-03-06 – 2020-03-07 (×2): 80 mg via ORAL
  Filled 2020-03-06 (×2): qty 1

## 2020-03-06 MED ORDER — HYDROMORPHONE HCL 1 MG/ML IJ SOLN
0.2500 mg | INTRAMUSCULAR | Status: DC | PRN
  Administered 2020-03-06: 0.5 mg via INTRAVENOUS

## 2020-03-06 MED ORDER — SODIUM CHLORIDE 0.9% FLUSH: 3.0000 mL | INTRAVENOUS | Status: DC | PRN

## 2020-03-06 MED ORDER — NALOXONE HCL 4 MG/10ML IJ SOLN
1.0000 ug/kg/h | INTRAVENOUS | Status: DC | PRN
  Filled 2020-03-06: qty 5

## 2020-03-06 MED ORDER — METOCLOPRAMIDE HCL 5 MG/ML IJ SOLN
INTRAMUSCULAR | Status: DC | PRN
Start: 2020-03-06 — End: 2020-03-06
  Administered 2020-03-06: 10 mg via INTRAVENOUS

## 2020-03-06 MED ORDER — OXYCODONE-ACETAMINOPHEN 5-325 MG PO TABS
2.0000 | ORAL_TABLET | ORAL | Status: DC | PRN
  Administered 2020-03-06 – 2020-03-07 (×2): 2 via ORAL
  Filled 2020-03-06 (×2): qty 2

## 2020-03-06 MED ORDER — WITCH HAZEL-GLYCERIN EX PADS: 1.0000 "application " | MEDICATED_PAD | CUTANEOUS | Status: DC | PRN

## 2020-03-06 MED ORDER — LACTATED RINGERS IV SOLN: INTRAVENOUS | Status: DC | PRN

## 2020-03-06 MED ORDER — PHENYLEPHRINE HCL-NACL 20-0.9 MG/250ML-% IV SOLN
INTRAVENOUS | Status: AC
  Filled 2020-03-06: qty 250

## 2020-03-06 MED ORDER — FENTANYL CITRATE (PF) 100 MCG/2ML IJ SOLN
INTRAMUSCULAR | Status: DC | PRN
  Administered 2020-03-06: 50 ug via EPIDURAL

## 2020-03-06 MED ORDER — DIPHENHYDRAMINE HCL 50 MG/ML IJ SOLN
INTRAMUSCULAR | Status: AC
  Filled 2020-03-06: qty 1

## 2020-03-06 MED ORDER — MENTHOL 3 MG MT LOZG: 1.0000 | LOZENGE | OROMUCOSAL | Status: DC | PRN

## 2020-03-06 MED ORDER — MAGNESIUM HYDROXIDE 400 MG/5ML PO SUSP: 30.0000 mL | ORAL | Status: DC | PRN

## 2020-03-06 MED ORDER — MORPHINE SULFATE (PF) 0.5 MG/ML IJ SOLN
INTRAMUSCULAR | Status: DC | PRN
  Administered 2020-03-06: 3 mg via EPIDURAL

## 2020-03-06 MED ORDER — PROMETHAZINE HCL 25 MG/ML IJ SOLN: 6.2500 mg | INTRAMUSCULAR | Status: DC | PRN

## 2020-03-06 MED ORDER — MEASLES, MUMPS & RUBELLA VAC IJ SOLR: 0.5000 mL | Freq: Once | INTRAMUSCULAR | Status: DC

## 2020-03-06 MED ORDER — DIPHENHYDRAMINE HCL 50 MG/ML IJ SOLN: 12.5000 mg | INTRAMUSCULAR | Status: DC | PRN

## 2020-03-06 MED ORDER — COCONUT OIL OIL: 1.0000 "application " | TOPICAL_OIL | Status: DC | PRN

## 2020-03-06 MED ORDER — FERROUS SULFATE 325 (65 FE) MG PO TABS
325.0000 mg | ORAL_TABLET | Freq: Two times a day (BID) | ORAL | Status: DC
  Administered 2020-03-06 – 2020-03-08 (×4): 325 mg via ORAL
  Filled 2020-03-06 (×4): qty 1

## 2020-03-06 MED ORDER — MORPHINE SULFATE (PF) 0.5 MG/ML IJ SOLN
INTRAMUSCULAR | Status: AC
  Filled 2020-03-06: qty 10

## 2020-03-06 MED ORDER — SODIUM BICARBONATE 8.4 % IV SOLN
INTRAVENOUS | Status: DC | PRN
  Administered 2020-03-06: 3 mL via EPIDURAL
  Administered 2020-03-06: 5 mL via EPIDURAL
  Administered 2020-03-06: 7 mL via EPIDURAL

## 2020-03-06 MED ORDER — LACTATED RINGERS AMNIOINFUSION: INTRAVENOUS | Status: DC

## 2020-03-06 MED ORDER — SCOPOLAMINE 1 MG/3DAYS TD PT72: 1.0000 | MEDICATED_PATCH | Freq: Once | TRANSDERMAL | Status: DC

## 2020-03-06 MED ORDER — ONDANSETRON HCL 4 MG/2ML IJ SOLN
INTRAMUSCULAR | Status: AC
  Filled 2020-03-06: qty 2

## 2020-03-06 MED ORDER — KETOROLAC TROMETHAMINE 30 MG/ML IJ SOLN
30.0000 mg | Freq: Once | INTRAMUSCULAR | Status: AC
  Administered 2020-03-06: 30 mg via INTRAVENOUS

## 2020-03-06 MED ORDER — SENNOSIDES-DOCUSATE SODIUM 8.6-50 MG PO TABS
2.0000 | ORAL_TABLET | ORAL | Status: DC
  Administered 2020-03-06 – 2020-03-07 (×2): 2 via ORAL
  Filled 2020-03-06 (×2): qty 2

## 2020-03-06 MED ORDER — ONDANSETRON HCL 4 MG/2ML IJ SOLN
INTRAMUSCULAR | Status: DC | PRN
  Administered 2020-03-06: 4 mg via INTRAVENOUS

## 2020-03-06 MED ORDER — STERILE WATER FOR IRRIGATION IR SOLN
Status: DC | PRN
  Administered 2020-03-06: 1000 mL

## 2020-03-06 MED ORDER — SODIUM CHLORIDE 0.9 % IV SOLN
INTRAVENOUS | Status: AC
  Filled 2020-03-06: qty 500

## 2020-03-06 MED ORDER — SODIUM BICARBONATE 8.4 % IV SOLN
INTRAVENOUS | Status: AC
  Filled 2020-03-06: qty 50

## 2020-03-06 MED ORDER — OXYCODONE HCL 5 MG PO TABS
5.0000 mg | ORAL_TABLET | ORAL | Status: DC | PRN
  Administered 2020-03-07 – 2020-03-08 (×3): 10 mg via ORAL
  Filled 2020-03-06 (×3): qty 2

## 2020-03-06 MED ORDER — MEPERIDINE HCL 25 MG/ML IJ SOLN
INTRAMUSCULAR | Status: AC
  Filled 2020-03-06: qty 1

## 2020-03-06 MED ORDER — PHENYLEPHRINE 40 MCG/ML (10ML) SYRINGE FOR IV PUSH (FOR BLOOD PRESSURE SUPPORT)
PREFILLED_SYRINGE | INTRAVENOUS | Status: AC
  Filled 2020-03-06: qty 10

## 2020-03-06 MED ORDER — HYDROMORPHONE HCL 1 MG/ML IJ SOLN
INTRAMUSCULAR | Status: AC
  Filled 2020-03-06: qty 0.5

## 2020-03-06 MED ORDER — SODIUM CHLORIDE 0.9 % IV SOLN
500.0000 mg | INTRAVENOUS | Status: AC
  Administered 2020-03-06: 500 mg via INTRAVENOUS

## 2020-03-06 SURGICAL SUPPLY — 34 items
CHLORAPREP W/TINT 26ML (MISCELLANEOUS) ×3 IMPLANT
CLAMP CORD UMBIL (MISCELLANEOUS) ×3 IMPLANT
CLOTH BEACON ORANGE TIMEOUT ST (SAFETY) ×3 IMPLANT
DRSG OPSITE POSTOP 4X10 (GAUZE/BANDAGES/DRESSINGS) ×3 IMPLANT
ELECT REM PT RETURN 9FT ADLT (ELECTROSURGICAL) ×3
ELECTRODE REM PT RTRN 9FT ADLT (ELECTROSURGICAL) ×1 IMPLANT
GLOVE BIOGEL PI IND STRL 7.0 (GLOVE) ×3 IMPLANT
GLOVE BIOGEL PI INDICATOR 7.0 (GLOVE) ×6
GLOVE ECLIPSE 7.0 STRL STRAW (GLOVE) ×3 IMPLANT
GOWN STRL REUS W/TWL LRG LVL3 (GOWN DISPOSABLE) ×6 IMPLANT
HOVERMATT SINGLE USE (MISCELLANEOUS) ×3 IMPLANT
KIT ABG SYR 3ML LUER SLIP (SYRINGE) ×3 IMPLANT
NEEDLE HYPO 22GX1.5 SAFETY (NEEDLE) ×3 IMPLANT
NEEDLE HYPO 25X5/8 SAFETYGLIDE (NEEDLE) ×3 IMPLANT
NS IRRIG 1000ML POUR BTL (IV SOLUTION) ×3 IMPLANT
PACK C SECTION WH (CUSTOM PROCEDURE TRAY) ×3 IMPLANT
PAD ABD 7.5X8 STRL (GAUZE/BANDAGES/DRESSINGS) ×3 IMPLANT
PAD ABD 8X7 1/2 STERILE (GAUZE/BANDAGES/DRESSINGS) ×6 IMPLANT
PAD OB MATERNITY 4.3X12.25 (PERSONAL CARE ITEMS) ×3 IMPLANT
PENCIL SMOKE EVAC W/HOLSTER (ELECTROSURGICAL) ×3 IMPLANT
RETRACTOR TRAXI PANNICULUS (MISCELLANEOUS) ×1 IMPLANT
RTRCTR C-SECT PINK 25CM LRG (MISCELLANEOUS) ×3 IMPLANT
SUT PLAIN 2 0 XLH (SUTURE) ×3 IMPLANT
SUT VIC AB 0 CTX 36 (SUTURE) ×4
SUT VIC AB 0 CTX36XBRD ANBCTRL (SUTURE) ×2 IMPLANT
SUT VIC AB 2-0 CT1 27 (SUTURE) ×2
SUT VIC AB 2-0 CT1 TAPERPNT 27 (SUTURE) ×1 IMPLANT
SUT VIC AB 4-0 KS 27 (SUTURE) ×3 IMPLANT
SYR CONTROL 10ML LL (SYRINGE) ×3 IMPLANT
TAPE CLOTH SURG 4X10 WHT LF (GAUZE/BANDAGES/DRESSINGS) ×3 IMPLANT
TOWEL OR 17X24 6PK STRL BLUE (TOWEL DISPOSABLE) ×3 IMPLANT
TRAXI PANNICULUS RETRACTOR (MISCELLANEOUS) ×2
TRAY FOLEY W/BAG SLVR 14FR LF (SET/KITS/TRAYS/PACK) ×3 IMPLANT
WATER STERILE IRR 1000ML POUR (IV SOLUTION) ×3 IMPLANT

## 2020-03-06 NOTE — Progress Notes (Signed)
LABOR PROGRESS NOTE  Lynn Duncan is a 34 y.o. G1P0 at [redacted]w[redacted]d  admitted for IOL due to uterine fibriods.  Subjective: Comfortable with epidural.   Objective: BP 118/60   Pulse (!) 59   Temp 97.8 F (36.6 C) (Oral)   Resp 16   Ht 5\' 4"  (1.626 m)   Wt 110.3 kg   LMP 05/30/2019   SpO2 99%   BMI 41.73 kg/m  or  Vitals:   03/05/20 2231 03/05/20 2301 03/05/20 2331 03/06/20 0001  BP: (!) 101/50 (!) 112/54 (!) 121/52 118/60  Pulse: 65 61 66 (!) 59  Resp: 15 14 15 16   Temp:      TempSrc:      SpO2:      Weight:      Height:        Dilation: 4.5 Effacement (%): 60 Station: -2 Presentation: Vertex Exam by:: Consuela Mimes, RN FHT: 145, moderate variability, pos accels, no decels, reactive Toco: q2-43m  Labs: Lab Results  Component Value Date   WBC 10.2 03/05/2020   HGB 11.7 (L) 03/05/2020   HCT 35.7 (L) 03/05/2020   MCV 81.7 03/05/2020   PLT 325 03/05/2020    Patient Active Problem List   Diagnosis Date Noted  . IUP (intrauterine pregnancy), incidental 03/05/2020  . GBS (group B Streptococcus carrier), +RV culture, currently pregnant 02/19/2020  . Uterine fibroid in pregnancy 01/19/2020  . Marijuana abuse 01/19/2020  . Late prenatal care 01/14/2020  . Supervision of high risk pregnancy, antepartum 01/04/2020    Assessment / Plan: 34 y.o. G1P0 at [redacted]w[redacted]d here for IOL due to uterine fibroids.  Labor: s/p 3 doses of cytotec and FB. SROM at [place]. IUPC placed left anterior due to difficulty monitoring ctx at times. Titrate Pit to adequate MVU's. Anticipate SVD. Fetal Wellbeing:  Cat I Pain Control:  Epidural  GBS: positive, adequate ppx with IV PCN   Barrington Ellison, MD OB Family Medicine Fellow, [facility] for [facility], [place] 03/06/2020, 12:34 AM

## 2020-03-06 NOTE — Discharge Summary (Signed)
Postpartum Discharge Summary     Patient Name: Lynn Duncan DOB: 10-Dec-1985 MRN: [id number]  Date of admission: 03/05/2020 Delivery date:03/06/2020  Delivering provider: Verita Schneiders A  Date of discharge: 03/08/2020  Admitting diagnosis: IUP (intrauterine pregnancy), incidental [Z33.1] Intrauterine pregnancy: [redacted]w[redacted]d    Secondary diagnosis:  Active Problems:   Late prenatal care   Uterine fibroid in pregnancy   GBS (group B Streptococcus carrier), +RV culture, currently pregnant   Postpartum care following vaginal delivery   Gestational hypertension  Additional problems: None    Discharge diagnosis: Term Pregnancy Delivered                                              Post partum procedures:None Augmentation: Pitocin, Cytotec and IP Foley Complications: None  Hospital course: Induction of Labor With Cesarean Section   34y.o. yo G1P0 at 456w1das admitted to the hospital 03/05/2020 for induction of labor. Patient had a labor course significant for IOL for large uterine fibroid. Initial SVE 0/thick/hi. Patient received Cytotec, IP Foley balloon and Pitocin. SROM occurred. Patient had recurrent late decelerations despite multiple interventions and cesarean recommended for NRFHT. Back in OR, fetal bradycardia occurred and section done in stat manner. The patient went for cesarean section due to Non-Reassuring FHR. Delivery details are as follows: Membrane Rupture Time/Date: 12:30 AM ,03/06/2020   Delivery Method:C-Section, Low Transverse  Details of operation can be found in separate operative Note. IUD placed intra-operatively. Patient diagnosed with gHTN postpartum and started on Norvasc which was prescribed on discharge. Patient had an uncomplicated postpartum course. She is ambulating, tolerating a regular diet, passing flatus, and urinating well.  Patient is discharged home in stable condition on 03/08/20.      Newborn Data: Birth date:03/06/2020  Birth time:9:11 AM   Gender:Female  Living status:Living  Apgars:1 ,8  Weight:3020 g                                 Magnesium Sulfate received: No BMZ received: No Rhophylac:N/A MMR:N/A T-DaP:no Flu: No Transfusion:No  Physical exam  Vitals:   03/07/20 0755 03/07/20 1305 03/07/20 2124 03/08/20 0500  BP: 122/83 120/72 125/73 (!) 130/91  Pulse: 67 70 72 78  Resp: 18 18  18   Temp: 98.1 F (36.7 C) 97.8 F (36.6 C) 98.4 F (36.9 C) 97.9 F (36.6 C)  TempSrc: Oral Oral Oral Oral  SpO2: 100% 99% 99% 100%  Weight:      Height:       General: alert, cooperative and no distress Lochia: appropriate Uterine Fundus: firm Incision: Healing well with no significant drainage, No significant erythema, Dressing is clean, dry, and intact DVT Evaluation: No evidence of DVT seen on physical exam. Labs: Lab Results  Component Value Date   WBC 16.8 (H) 03/07/2020   HGB 9.1 (L) 03/07/2020   HCT 28.8 (L) 03/07/2020   MCV 82.3 03/07/2020   PLT 226 03/07/2020   CMP Latest Ref Rng & Units 03/07/2020  Glucose 65 - 99 mg/dL -  BUN 6 - 20 mg/dL -  Creatinine 0.44 - 1.00 mg/dL 0.65  Sodium 134 - 144 mmol/L -  Potassium 3.5 - 5.2 mmol/L -  Chloride 96 - 106 mmol/L -  CO2 20 - 29 mmol/L -  Calcium 8.7 - 10.2  mg/dL -  Total Protein 6.0 - 8.5 g/dL -  Total Bilirubin 0.0 - 1.2 mg/dL -  Alkaline Phos 39 - 117 IU/L -  AST 0 - 40 IU/L -  ALT 0 - 32 IU/L -   Edinburgh Score: Edinburgh Postnatal Depression Scale Screening Tool 03/08/2020  I have been able to laugh and see the funny side of things. 0  I have looked forward with enjoyment to things. 0  I have blamed myself unnecessarily when things went wrong. 0  I have been anxious or worried for no good reason. 0  I have felt scared or panicky for no good reason. 0  Things have been getting on top of me. 0  I have been so unhappy that I have had difficulty sleeping. 0  I have felt sad or miserable. 0  I have been so unhappy that I have been crying. 0  The  thought of harming myself has occurred to me. 0  Edinburgh Postnatal Depression Scale Total 0     After visit meds:  Allergies as of 03/08/2020   No Known Allergies     Medication List    STOP taking these medications   acetaminophen 325 MG tablet Commonly known as: TYLENOL   famotidine 20 MG tablet Commonly known as: Pepcid   ondansetron 4 MG disintegrating tablet Commonly known as: Zofran ODT   promethazine 25 MG tablet Commonly known as: PHENERGAN     TAKE these medications   ibuprofen 800 MG tablet Commonly known as: ADVIL Take 1 tablet (800 mg total) by mouth every 6 (six) hours.   oxyCODONE-acetaminophen 5-325 MG tablet Commonly known as: PERCOCET/ROXICET Take 2 tablets by mouth every 4 (four) hours as needed for severe pain ((when tolerating fluids)).   prenatal multivitamin Tabs tablet Take 1 tablet by mouth daily at 12 noon.   senna-docusate 8.6-50 MG tablet Commonly known as: Senokot-S Take 2 tablets by mouth daily. Start taking on: March 09, 2020        Discharge home in stable condition Infant Feeding: Breast Infant Disposition:home with mother Discharge instruction: per After Visit Summary and Postpartum booklet. Activity: Advance as tolerated. Pelvic rest for 6 weeks.  Diet: routine diet Future Appointments:No future appointments. Follow up Visit:   Please schedule this patient for a In person postpartum visit in 4 weeks with the following provider: Any provider. Additional Postpartum F/U:Incision check 1 week and IUD string check; BP check in 1 week  Low risk pregnancy complicated by: none Delivery mode:  C-Section, Low Transverse  Anticipated Birth Control:  IUD   03/08/2020 Chauncey Mann, MD

## 2020-03-06 NOTE — Transfer of Care (Signed)
Immediate Anesthesia Transfer of Care Note  Patient: Lynn Duncan  Procedure(s) Performed: CESAREAN SECTION WITH IUD INSERTION (N/A Abdomen)  Patient Location: PACU  Anesthesia Type:Epidural  Level of Consciousness: awake, alert  and oriented  Airway & Oxygen Therapy: Patient Spontanous Breathing  Post-op Assessment: Report given to RN and Post -op Vital signs reviewed and stable  Post vital signs: Reviewed and stable  Last Vitals:  Vitals Value Taken Time  BP 100/60 03/06/20 1030  Temp 36.8 C 03/06/20 1020  Pulse 77 03/06/20 1038  Resp 21 03/06/20 1038  SpO2 100 % 03/06/20 1038  Vitals shown include unvalidated device data.  Last Pain:  Vitals:   03/06/20 1020  TempSrc: Oral  PainSc:          Complications: No complications documented.

## 2020-03-06 NOTE — Lactation Note (Signed)
This note was copied from a baby's chart. Lactation Consultation Note  Patient Name: Lynn Duncan Today's Date: 03/06/2020 Reason for consult: Initial assessment;1st time breastfeeding;Term P1, 8 hour term female infant. Per mom, she is active on the [facility] in [place]. Tools given: Breast shells due to nipple edema in right breast, DEBP due to infant not latching at this time to help establish mom's milk supply. Per mom, initially she was going to formula feed , infant is refusing formula and she currently wants to breast and formula fed her baby.  Mom appeared excited about attempt to breastfeed infant and that she had colostrum in her breast, infant was given 5 mls of colostrum by spoon. Mom attempted to latch infant on her right breast using the football position, infant latched well but did not elicit the suck-swallow reflex only held breast in mouth. LC discussed with mom this is not uncommon for an infant less than 24 hours of life, mom will continue to latch infant or make attempts, she know how to hand express and given infant EBM before offering formula. Mom knows to BF according to cues, on demand, 8 to 12+ within 24 hours and not exceed 3 hours without feeding infant. Mom will pump every 3 hours for 15 minutes on initial setting, mom was pumping as LC left room.  Mom knows to ask RN or LC for assistance if needed with latching infant at breast. Reviewed Baby & Me book's Breastfeeding Basics.  Mom made aware of O/P services, breastfeeding support groups, community resources, and our phone # for post-discharge questions.    Maternal Data Formula Feeding for Exclusion: Yes Reason for exclusion: Mother's choice to formula feed on admision Has patient been taught Hand Expression?: Yes Does the patient have breastfeeding experience prior to this delivery?: No  Feeding Feeding Type: Breast Fed Nipple Type: Extra Slow Flow  LATCH Score Latch: Too sleepy or  reluctant, no latch achieved, no sucking elicited.  Audible Swallowing: None  Type of Nipple: Everted at rest and after stimulation  Comfort (Breast/Nipple): Soft / non-tender  Hold (Positioning): Assistance needed to correctly position infant at breast and maintain latch.  LATCH Score: 5  Interventions Interventions: Breast feeding basics reviewed;Reverse pressure;Assisted with latch;Breast compression;Shells;Skin to skin;Adjust position;Breast massage;Support pillows;Hand express;Position options;DEBP;Pre-pump if needed;Expressed milk  Lactation Tools Discussed/Used Tools: Shells;Pump Breast pump type: Double-Electric Breast Pump WIC Program: Yes Pump Review: Setup, frequency, and cleaning;Milk Storage Initiated by:: Vicente Serene, IBCLC Date initiated:: 03/06/20   Consult Status Consult Status: Follow-up Date: 03/07/20 Follow-up type: In-patient    Vicente Serene 03/06/2020, 5:24 PM

## 2020-03-06 NOTE — Progress Notes (Signed)
Went bedside for recurrent lates. Pitocin already turned off again. Will give Terb as well, as lates still occurring. Cervix has thinned out some but otherwise unchanged. D/w Dr. Ilda Basset. Will let baby recover after terb and restart Pit around 0800 if able. Amnioinfusion has been going since around 0330. IUPC in place and never reached adequate MVU's.   Barrington Ellison, MD [facility] Family Medicine Fellow, [facility] for [employer], [place]

## 2020-03-06 NOTE — Op Note (Signed)
[name] PROCEDURE DATE: 03/06/2020  PREOPERATIVE DIAGNOSES: Intrauterine pregnancy at [redacted]w[redacted]d weeks gestation; non-reassuring fetal status; failed induction of labor; desires long acting reversible contraception  POSTOPERATIVE DIAGNOSES: The same  PROCEDURE: Primary Low Transverse Cesarean Section and Intrauterine Device Placement [name])  SURGEON:  Dr. Verita Schneiders  ASSISTANT:  Dr. Barrington Ellison  ANESTHESIOLOGY TEAM: Anesthesiologist: Lynda Rainwater, MD; [name], MD  INDICATIONS: Lynn Duncan is a 34 y.o. G1P0 at [redacted]w[redacted]d here for cesarean section secondary to the indications listed under preoperative diagnoses; please see preoperative note for further details.  The risks of surgery were discussed with the patient including but were not limited to: bleeding which may require transfusion or reoperation; infection which may require antibiotics; injury to bowel, bladder, ureters or other surrounding organs; injury to the fetus; need for additional procedures including hysterectomy in the event of a life-threatening hemorrhage; formation of adhesions; placental abnormalities wth subsequent pregnancies; incisional problems; thromboembolic phenomenon and other postoperative/anesthesia complications.  The patient concurred with the proposed plan, giving informed written consent for the procedure.    Of note, while in the OR waiting for a surgical level, the FHR tracing noted a prolonged deceleration to the 90s, and this escalated the urgency of the case. The FHR was in 150s prior to this.  She already had a surgical level so infant was emergently delivered as per details below.  FINDINGS:  Viable female infant in cephalic presentation.  Apgars 1 and 8, arterial cord pH 7.23.   Heavy meconium in amniotic fluid, one nuchal cord present.  Intact placenta, three vessel cord.  Normal uterus, fallopian tubes and ovaries bilaterally. There was some bleeding due to mild atony, this was  ameliorated with Methergine administration, also received TXA. Liletta IUD was placed.  Patient had a known large left fundal fibroid that did not affect exposure during the case.   ANESTHESIA: Epidural  ESTIMATED BLOOD LOSS: 800 ml SPECIMENS: Placenta sent to pathology COMPLICATIONS: None immediate  PROCEDURE IN DETAIL:  The patient was taken to the the operating room her epidural anesthesia was dosed up to surgical level and was found to be adequate. She was then placed in a dorsal supine position with a leftward tilt.  Once the urgency was escalated, she was prepped quickly with betadine and draped in a sterile manner.  She already had a foley catheter in her bladder from L&D.  After a timeout was performed, a Pfannenstiel skin incision was made with scalpel and carried through to the underlying layer of fascia. The fascial incision was extended bilaterally in a blunt fashion.  The fascia was separated from underlying rectus muscles bluntly.  The rectus muscles were separated in the midline bluntly and the peritoneum was entered bluntly. Attention was turned to the lower uterine segment where a low transverse hysterotomy was made with a scalpel and extended bilaterally bluntly.  The infant was successfully delivered, the cord was clamped and cut and the infant was handed over to awaiting neonatology team. Incision to delivery time was less than one minute.  The placenta was delivered intact and had a three-vessel cord. The uterus was then cleared of clot and debris.  The Liletta IUD was placed in the fundal portion of the uterus and the strings were pushed through the internal cervical os.   The hysterotomy was closed with 0 Vicryl in a running locked fashion, and an imbricating layer was also placed with 0 Vicryl.  Figure-of-eight 0 Vicryl serosal stitches were placed to help with hemostasis.  The pelvis was cleared of all clot and debris. Hemostasis was confirmed on all surfaces.  The retractor was  removed.  The peritoneum was closed with a 2-0 Vicryl running stitch. The fascia was then closed using 0 Vicryl in a running fashion.  The subcutaneous layer was irrigated, reapproximated with 2-0 plain gut interrupted stitches, and the skin was closed with a 4-0 Vicryl subcuticular stitch. The patient tolerated the procedure well. Sponge, instrument and needle counts were correct x 3.  She was taken to the recovery room in stable condition.    Verita Schneiders, MD, [place] for [employer], [place]

## 2020-03-06 NOTE — Progress Notes (Signed)
LABOR PROGRESS NOTE  Lynn Duncan is a 34 y.o. G1P0 at [redacted]w[redacted]d  admitted for IOL due to uterine fibriods.  Subjective: Comfortable with epidural. Sleeping.  Objective: BP (!) 113/98   Pulse 69   Temp 98.3 F (36.8 C) (Oral)   Resp 15   Ht 5\' 4"  (1.626 m)   Wt 110.3 kg   LMP 05/30/2019   SpO2 99%   BMI 41.73 kg/m  or  Vitals:   03/06/20 0131 03/06/20 0145 03/06/20 0201 03/06/20 0231  BP:  (!) 104/50 (!) 106/45 (!) 113/98  Pulse:  (!) 52 61 69  Resp: 16  16 15   Temp:      TempSrc:      SpO2:      Weight:      Height:        Dilation: 4.5 Effacement (%): 60 Station: -2 Presentation: Vertex Exam by:: Consuela Mimes, RN FHT: 150, moderate variability, no accels currently, intermittent lates Toco: q3-103m  Labs: Lab Results  Component Value Date   WBC 10.2 03/05/2020   HGB 11.7 (L) 03/05/2020   HCT 35.7 (L) 03/05/2020   MCV 81.7 03/05/2020   PLT 325 03/05/2020    Patient Active Problem List   Diagnosis Date Noted  . IUP (intrauterine pregnancy), incidental 03/05/2020  . GBS (group B Streptococcus carrier), +RV culture, currently pregnant 02/19/2020  . Uterine fibroid in pregnancy 01/19/2020  . Marijuana abuse 01/19/2020  . Late prenatal care 01/14/2020  . Supervision of high risk pregnancy, antepartum 01/04/2020    Assessment / Plan: 34 y.o. G1P0 at [redacted]w[redacted]d here for IOL due to uterine fibroids.  Labor: s/p 3 doses of cytotec and FB. SROM at [place]. IUPC in place. Pit stopped at 0214 due to recurrent lates. Still contracting however MVU's far from inadequate. Cont position changes as able. Will trial amnioinfusion although decels are not variables. Restart Pit if Cat I for 20 min. Anticipate SVD. Fetal Wellbeing:  Cat II; reassuring for moderate variability Pain Control:  Epidural  GBS: positive, adequate ppx with IV PCN   Barrington Ellison, MD OB Family Medicine Fellow, [facility] for [facility], [facility] 03/06/2020, 3:42  AM

## 2020-03-06 NOTE — Anesthesia Postprocedure Evaluation (Signed)
Anesthesia Post Note  Patient: Raylinn Kosar  Procedure(s) Performed: CESAREAN SECTION WITH IUD INSERTION (N/A Abdomen)     Patient location during evaluation: PACU Anesthesia Type: Epidural Level of consciousness: awake and alert Pain management: pain level controlled Vital Signs Assessment: post-procedure vital signs reviewed and stable Respiratory status: spontaneous breathing, nonlabored ventilation and respiratory function stable Cardiovascular status: blood pressure returned to baseline and stable Postop Assessment: no apparent nausea or vomiting Anesthetic complications: no   No complications documented.  Last Vitals:  Vitals:   03/06/20 1030 03/06/20 1045  BP: 100/60 96/70  Pulse: 87 74  Resp: 18 19  Temp:    SpO2: 100% 99%    Last Pain:  Vitals:   03/06/20 1100  TempSrc:   PainSc: 3    Pain Goal:    LLE Motor Response: Purposeful movement (03/06/20 1045) LLE Sensation: Tingling (03/06/20 1045) RLE Motor Response: Purposeful movement (03/06/20 1045) RLE Sensation: Tingling (03/06/20 1045)     Epidural/Spinal Function Cutaneous sensation: Tingles (03/06/20 1045), Patient able to flex knees: Yes (03/06/20 1045), Patient able to lift hips off bed: No (03/06/20 1045), Back pain beyond tenderness at insertion site: No (03/06/20 1045), Progressively worsening motor and/or sensory loss: No (03/06/20 1045), Bowel and/or bladder incontinence post epidural: No (03/06/20 1045)  Lynda Rainwater

## 2020-03-06 NOTE — Progress Notes (Signed)
Called by RN for late decels despite boluses and position changes. Went bedside. BP's still low with diastolic BP's ranging 21'C-28'Q. Phenylephrine given and patient placed in exaggerated left lateral position. Lates resolving with moderate variability; will cont to monitor. MVU's not yet adequate. Will cont Pitocin.   Barrington Ellison, MD [facility] Family Medicine Fellow, [facility] for [employer], [place]

## 2020-03-06 NOTE — Progress Notes (Addendum)
Occasional lates and variables persist despite terb, no Pitocin and infrequent ctx; remote from delivery. D/w Dr. Harolyn Rutherford and cesarean section recommended. Patient amenable.  The risks of surgery were discussed with the patient including but were not limited to: bleeding which may require transfusion or reoperation; infection which may require antibiotics; injury to bowel, bladder, ureters or other surrounding organs; injury to the fetus; need for additional procedures including hysterectomy in the event of a life-threatening hemorrhage; formation of adhesions; placental abnormalities wth subsequent pregnancies; incisional problems; thromboembolic phenomenon and other postoperative/anesthesia complications.  Also desires IUD insertion. Discussed risks of irregular bleeding, cramping, infection, malpositioning or misplacement of the IUD outside the uterus which may require further procedure such as laparoscopy. Discussed higher risk of expulsion in post-partum period. Also discussed >99% contraception efficacy, increased risk of ectopic pregnancy with failure of method. The patient concurred with the proposed plan, giving informed written consent for the procedure.   Patient has been NPO since midnight she will remain NPO for procedure. Anesthesia and OR aware. Preoperative prophylactic antibiotics and SCDs ordered on call to the OR.  To OR when ready.  Barrington Ellison, MD OB Family Medicine Fellow, [facility] for [facility], [place] of Attending Supervision of Obstetric Fellow: Evaluation and management procedures were performed by the Obstetric Fellow under my supervision and collaboration.  I have reviewed the Obstetric Fellow's note and chart, and I agree with the management and plan.  Patient was met and reviewed plan as discussed. To OR soon.  Verita Schneiders, MD, [place] Attending [place], [facility] for [employer], [place]

## 2020-03-07 LAB — CBC
HCT: 28.8 % — ABNORMAL LOW (ref 36.0–46.0)
Hemoglobin: 9.1 g/dL — ABNORMAL LOW (ref 12.0–15.0)
MCH: 26 pg (ref 26.0–34.0)
MCHC: 31.6 g/dL (ref 30.0–36.0)
MCV: 82.3 fL (ref 80.0–100.0)
Platelets: 226 10*3/uL (ref 150–400)
RBC: 3.5 MIL/uL — ABNORMAL LOW (ref 3.87–5.11)
RDW: 16 % — ABNORMAL HIGH (ref 11.5–15.5)
WBC: 16.8 10*3/uL — ABNORMAL HIGH (ref 4.0–10.5)
nRBC: 0 % (ref 0.0–0.2)

## 2020-03-07 LAB — CREATININE, SERUM
Creatinine, Ser: 0.65 mg/dL (ref 0.44–1.00)
GFR calc Af Amer: >60 mL/min (ref >60)
GFR calc non Af Amer: >60 mL/min (ref >60)

## 2020-03-07 MED ORDER — PNEUMOCOCCAL VAC POLYVALENT 25 MCG/0.5ML IJ INJ
0.5000 mL | INJECTION | INTRAMUSCULAR | Status: AC
  Administered 2020-03-08: 0.5 mL via INTRAMUSCULAR
  Filled 2020-03-07: qty 0.5

## 2020-03-07 NOTE — Progress Notes (Signed)
Subjective: Postpartum Day 1: Cesarean Delivery Patient reports feeling well. She has ambulated and urinated. Passing flatus. Lochia minimal. Bottle feeding currently. Pain has been moderately controlled. Overnight paged for PO ibuprofen as patient had lost IV and could not take Toradol.   Objective: Vital signs in last 24 hours: Temp:  [98 F (36.7 C)-98.5 F (36.9 C)] 98.2 F (36.8 C) (06/28 0530) Pulse Rate:  [60-88] 62 (06/28 0530) Resp:  [13-24] 18 (06/28 0530) BP: (96-138)/(56-89) 126/70 (06/28 0530) SpO2:  [97 %-100 %] 99 % (06/28 0530)  Physical Exam:  General: alert, cooperative, appears stated age and no distress Lochia: appropriate Uterine Fundus: firm Incision: clean pressure dressing in place DVT Evaluation: No evidence of DVT seen on physical exam.  Recent Labs    03/05/20 0748 03/07/20 0416  HGB 11.7* 9.1*  HCT 35.7* 28.8*    Assessment/Plan: Status post Cesarean section. Doing well postoperatively.  Continue current care. Likely DC POD#2 Vitals stable PO iron initiated; prescribe on discharge IUD in place; string check requested Will add gabapentin while here for pain control  Jasun Gasparini N Migel Hannis 03/07/2020, 7:27 AM

## 2020-03-07 NOTE — Progress Notes (Addendum)
Patient iv was taken out at start of shift due to having swelling at iv site. Tried several time to access a new site. Was not able to access site . Patient has cather is voiding .Patient is tolerating po fluids Call Dr. Marice Potter due to patient having order for IV tordal to see if it was ok to give IM Dr. Marice Potter stated that it was ok not to give since we did not have access . States to give po ibuporen

## 2020-03-07 NOTE — Clinical Social Work Maternal (Signed)
CLINICAL SOCIAL WORK MATERNAL/CHILD NOTE  Patient Details  Name: Lynn Duncan MRN: [id number] Date of Birth: 12-31-1985  Date:  03/07/2020  Clinical Social Worker Initiating Note:  Elijio Miles Date/Time: Initiated:  03/07/20/0926     Child's Name:  [name]   Biological Parents:  Mother (FOB not involved)   Need for Interpreter:  None   Reason for Referral:  Current Substance Use/Substance Use During Pregnancy     Address:  [place] [address]    Phone number:  [phone] (home)   [phone] ([place])  Additional phone number:   Household Members/Support Persons (HM/SP):   Household Member/Support Person 1   HM/SP Name Relationship DOB or Age  HM/SP -1 Tellius Malachi Fiance    HM/SP -2        HM/SP -3        HM/SP -4        HM/SP -5        HM/SP -6        HM/SP -7        HM/SP -8          Natural Supports (not living in the home):  Spouse/significant other, Parent, Friends   Chiropodist: None   Employment: Unemployed   Type of Work:     Education:  Programmer, systems   Homebound arranged:    Museum/gallery curator Resources:  Medicaid   Other Resources:  Physicist, medical  , [place] Considerations Which May Impact Care:    Strengths:  Ability to meet basic needs  , Home prepared for child  , Pediatrician chosen   Psychotropic Medications:         Pediatrician:    [place] area  Pediatrician List:   [place] for [place]      Pediatrician Fax Number:    Risk Factors/Current Problems:  Substance Use     Cognitive State:  Able to Concentrate  , Alert  , Linear Thinking     Mood/Affect:  Bright  , Happy  , Calm  , Comfortable  , Interested     CSW Assessment:  CSW received consult for THC use during pregnancy. CSW met with MOB to offer support and complete  assessment.    MOB laying in bed holding infant, when CSW entered the room. CSW introduced self and explained reason for consult to which MOB expressed understanding. MOB pleasant and welcoming of CSW visit and appeared to be well-bonded to infant. Per MOB, she currently lives with her fiance, Tellius Malachi. MOB confirmed she receives both [facility] and food stamps and is aware she would need to reach out and update both of them of her delivery to get infant added to her plans. CSW inquired about MOB's mental health history to which MOB shared she hasn't had any mental health concerns since she was 34 years old. MOB denied any symptoms during her pregnancy as well as any current symptoms. MOB reported feeling "amazing" and "happy" to be a new mom. CSW provided education regarding the baby blues period vs. perinatal mood disorders, discussed treatment and gave resources for mental health follow up if concerns arise. CSW recommended self-evaluation during the postpartum time period using the New Mom Checklist from Postpartum Progress and encouraged MOB to contact a medical professional if symptoms are noted at any time.  MOB denied any current SI, HI or DV and shared having a great support system consisting of her fiance, mother and best friend.   CSW inquired about MOB's substance use history to which MOB acknowledged use of marijuana during pregnancy to help with her nausea and appetite. Per MOB, she only used twice during pregnancy with last use being a month and a half ago. CSW informed MOB of [place] and explained infant's UDS came back negative but that CDS was still pending and that a CPS report would be made, if warranted. CSW discussed with MOB what process would look like in the event CPS report was made. MOB denied any questions or concerns regarding policy.   MOB confirmed having all essential items for infant once discharged and stated infant would be sleeping in a bassinet once home. CSW  provided review of Sudden Infant Death Syndrome (SIDS) precautions and safe sleeping habits.     CSW Plan/Description:  No Further Intervention Required/No Barriers to Discharge, Sudden Infant Death Syndrome (SIDS) Education, Perinatal Mood and Anxiety Disorder (PMADs) Education, [place], CSW Will Continue to Monitor Umbilical Cord Tissue Drug Screen Results and Make Report if [facility], LCSW 03/07/2020, 10:03 AM

## 2020-03-07 NOTE — Lactation Note (Signed)
This note was copied from a baby's chart. Lactation Consultation Note  Patient Name: Lynn Duncan [id number] Date: 03/07/2020 Reason for consult: Follow-up assessment  LC Follow up Visit:  Mother's feeding preference on admission was formula.  On 03/06/20 she saw a Science writer, however, mother has not breast fed since this visit.  Her current feeding preference remains formula.  No further [place] visits are necessary.  RN updated.   Maternal Data    Feeding Feeding Type: Bottle Fed - Formula  LATCH Score                   Interventions    Lactation Tools Discussed/Used     Consult Status Consult Status: Complete Date: 03/07/20 Follow-up type: Call as needed    Krystale Rinkenberger R Karyn Brull 03/07/2020, 11:39 AM

## 2020-03-07 NOTE — Progress Notes (Signed)
Patient lost iv access . Called Dr. Marice Potter states that she can have po ibuprofen 800mg .

## 2020-03-08 DIAGNOSIS — O139 Gestational [pregnancy-induced] hypertension without significant proteinuria, unspecified trimester: Secondary | ICD-10-CM

## 2020-03-08 LAB — SURGICAL PATHOLOGY

## 2020-03-08 MED ORDER — OXYCODONE-ACETAMINOPHEN 5-325 MG PO TABS: 2.0000 | ORAL_TABLET | ORAL | 0 refills | Status: DC | PRN

## 2020-03-08 MED ORDER — AMLODIPINE BESYLATE 2.5 MG PO TABS: 2.5000 mg | ORAL_TABLET | Freq: Every day | ORAL | 0 refills | Status: DC

## 2020-03-08 MED ORDER — SENNOSIDES-DOCUSATE SODIUM 8.6-50 MG PO TABS: 2.0000 | ORAL_TABLET | ORAL | 0 refills | Status: DC

## 2020-03-08 MED ORDER — IBUPROFEN 800 MG PO TABS: 800.0000 mg | ORAL_TABLET | Freq: Four times a day (QID) | ORAL | 0 refills | Status: DC

## 2020-03-08 MED ORDER — AMLODIPINE BESYLATE 5 MG PO TABS
2.5000 mg | ORAL_TABLET | Freq: Every day | ORAL | Status: DC
  Administered 2020-03-08: 2.5 mg via ORAL
  Filled 2020-03-08: qty 1

## 2020-03-08 MED FILL — SENEXON-S 8.6-50 MG TABS: 8.6-50 | 15 days supply | Qty: 30 | Fill #0

## 2020-03-08 MED FILL — AMLODIPINE BESYLATE 2.5 MG: 2.5 | 60 days supply | Qty: 60 | Fill #0

## 2020-03-08 MED FILL — OXYCODONE-APAP 5-325MG: 5-325 | 4 days supply | Qty: 28 | Fill #0

## 2020-03-08 MED FILL — IBUPROFEN 800 MG TAB: 800 | 8 days supply | Qty: 30 | Fill #0

## 2020-03-08 NOTE — Discharge Instructions (Signed)
Postpartum Care After Cesarean Delivery This sheet gives you information about how to care for yourself from the time you deliver your baby to up to 6-12 weeks after delivery (postpartum period). Your health care provider may also give you more specific instructions. If you have problems or questions, contact your health care provider. Follow these instructions at home: Medicines  Take over-the-counter and prescription medicines only as told by your health care provider.  If you were prescribed an antibiotic medicine, take it as told by your health care provider. Do not stop taking the antibiotic even if you start to feel better.  Ask your health care provider if the medicine prescribed to you: ? Requires you to avoid driving or using heavy machinery. ? Can cause constipation. You may need to take actions to prevent or treat constipation, such as:  Drink enough fluid to keep your urine pale yellow.  Take over-the-counter or prescription medicines.  Eat foods that are high in fiber, such as beans, whole grains, and fresh fruits and vegetables.  Limit foods that are high in fat and processed sugars, such as fried or sweet foods. Activity  Gradually return to your normal activities as told by your health care provider.  Avoid activities that take a lot of effort and energy (are strenuous) until approved by your health care provider. Walking at a slow to moderate pace is usually safe. Ask your health care provider what activities are safe for you. ? Do not lift anything that is heavier than your baby or 10 lb (4.5 kg) as told by your health care provider. ? Do not vacuum, climb stairs, or drive a car for as long as told by your health care provider.  If possible, have someone help you at home until you are able to do your usual activities yourself.  Rest as much as possible. Try to rest or take naps while your baby is sleeping. Vaginal bleeding  It is normal to have vaginal bleeding  (lochia) after delivery. Wear a sanitary pad to absorb vaginal bleeding and discharge. ? During the first week after delivery, the amount and appearance of lochia is often similar to a menstrual period. ? Over the next few weeks, it will gradually decrease to a dry, yellow-brown discharge. ? For most women, lochia stops completely by 4-6 weeks after delivery. Vaginal bleeding can vary from woman to woman.  Change your sanitary pads frequently. Watch for any changes in your flow, such as: ? A sudden increase in volume. ? A change in color. ? Large blood clots.  If you pass a blood clot, save it and call your health care provider to discuss. Do not flush blood clots down the toilet before you get instructions from your health care provider.  Do not use tampons or douches until your health care provider says this is safe.  If you are not breastfeeding, your period should return 6-8 weeks after delivery. If you are breastfeeding, your period may return anytime between 8 weeks after delivery and the time that you stop breastfeeding. Perineal care   If your C-section (Cesarean section) was unplanned, and you were allowed to labor and push before delivery, you may have pain, swelling, and discomfort of the tissue between your vaginal opening and your anus (perineum). You may also have an incision in the tissue (episiotomy) or the tissue may have torn during delivery. Follow these instructions as told by your health care provider: ? Keep your perineum clean and dry as told by   your health care provider. Use medicated pads and pain-relieving sprays and creams as directed. ? If you have an episiotomy or vaginal tear, check the area every day for signs of infection. Check for:  Redness, swelling, or pain.  Fluid or blood.  Warmth.  Pus or a bad smell. ? You may be given a squirt bottle to use instead of wiping to clean the perineum area after you go to the bathroom. As you start healing, you may use  the squirt bottle before wiping yourself. Make sure to wipe gently. ? To relieve pain caused by an episiotomy, vaginal tear, or hemorrhoids, try taking a warm sitz bath 2-3 times a day. A sitz bath is a warm water bath that is taken while you are sitting down. The water should only come up to your hips and should cover your buttocks. Breast care  Within the first few days after delivery, your breasts may feel heavy, full, and uncomfortable (breast engorgement). You may also have milk leaking from your breasts. Your health care provider can suggest ways to help relieve breast discomfort. Breast engorgement should go away within a few days.  If you are breastfeeding: ? Wear a bra that supports your breasts and fits you well. ? Keep your nipples clean and dry. Apply creams and ointments as told by your health care provider. ? You may need to use breast pads to absorb milk leakage. ? You may have uterine contractions every time you breastfeed for several weeks after delivery. Uterine contractions help your uterus return to its normal size. ? If you have any problems with breastfeeding, work with your health care provider or a lactation consultant.  If you are not breastfeeding: ? Avoid touching your breasts as this can make your breasts produce more milk. ? Wear a well-fitting bra and use cold packs to help with swelling. ? Do not squeeze out (express) milk. This causes you to make more milk. Intimacy and sexuality  Ask your health care provider when you can engage in sexual activity. This may depend on your: ? Risk of infection. ? Healing rate. ? Comfort and desire to engage in sexual activity.  You are able to get pregnant after delivery, even if you have not had your period. If desired, talk with your health care provider about methods of family planning or birth control (contraception). Lifestyle  Do not use any products that contain nicotine or tobacco, such as cigarettes, e-cigarettes,  and chewing tobacco. If you need help quitting, ask your health care provider.  Do not drink alcohol, especially if you are breastfeeding. Eating and drinking   Drink enough fluid to keep your urine pale yellow.  Eat high-fiber foods every day. These may help prevent or relieve constipation. High-fiber foods include: ? Whole grain cereals and breads. ? Brown rice. ? Beans. ? Fresh fruits and vegetables.  Take your prenatal vitamins until your postpartum checkup or until your health care provider tells you it is okay to stop. General instructions  Keep all follow-up visits for you and your baby as told by your health care provider. Most women visit their health care provider for a postpartum checkup within the first 3-6 weeks after delivery. Contact a health care provider if you:  Feel unable to cope with the changes that a new baby brings to your life, and these feelings do not go away.  Feel unusually sad or worried.  Have breasts that are painful, hard, or turn red.  Have a fever.    Have trouble holding urine or keeping urine from leaking.  Have little or no interest in activities you used to enjoy.  Have not breastfed at all and you have not had a menstrual period for 12 weeks after delivery.  Have stopped breastfeeding and you have not had a menstrual period for 12 weeks after you stopped breastfeeding.  Have questions about caring for yourself or your baby.  Pass a blood clot from your vagina. Get help right away if you:  Have chest pain.  Have difficulty breathing.  Have sudden, severe leg pain.  Have severe pain or cramping in your abdomen.  Bleed from your vagina so much that you fill more than one sanitary pad in one hour. Bleeding should not be heavier than your heaviest period.  Develop a severe headache.  Faint.  Have blurred vision or spots in your vision.  Have a bad-smelling vaginal discharge.  Have thoughts about hurting yourself or your  baby. If you ever feel like you may hurt yourself or others, or have thoughts about taking your own life, get help right away. You can go to your nearest emergency department or call:  Your local emergency services (911 in the [place]  A suicide crisis helpline, such as the [place] at 6395687060. This is open 24 hours a day. Summary  The period of time from when you deliver your baby to up to 6-12 weeks after delivery is called the postpartum period.  Gradually return to your normal activities as told by your health care provider.  Keep all follow-up visits for you and your baby as told by your health care provider. This information is not intended to replace advice given to you by your health care provider. Make sure you discuss any questions you have with your health care provider. Document Revised: 04/16/2018 Document Reviewed: 04/16/2018 Elsevier Patient Education  [place]. Hypertension During Pregnancy Hypertension is also called high blood pressure. High blood pressure means that the force of your blood moving in your body is too strong. It can cause problems for you and your baby. Different types of high blood pressure can happen during pregnancy. The types are:  High blood pressure before you got pregnant. This is called chronic hypertension.  This can continue during your pregnancy. Your doctor will want to keep checking your blood pressure. You may need medicine to keep your blood pressure under control while you are pregnant. You will need follow-up visits after you have your baby.  High blood pressure that goes up during pregnancy when it was normal before. This is called gestational hypertension. It will usually get better after you have your baby, but your doctor will need to watch your blood pressure to make sure that it is getting better.  Very high blood pressure during pregnancy. This is called preeclampsia. Very high blood pressure  is an emergency that needs to be checked and treated right away.  You may develop very high blood pressure after giving birth. This is called postpartum preeclampsia. This usually occurs within 48 hours after childbirth but may occur up to 6 weeks after giving birth. This is rare. How does this affect me? If you have high blood pressure during pregnancy, you have a higher chance of developing high blood pressure:  As you get older.  If you get pregnant again. In some cases, high blood pressure during pregnancy can cause:  Stroke.  Heart attack.  Damage to the kidneys, lungs, or liver.  Preeclampsia.  Jerky movements you cannot control (convulsions or seizures).  Problems with the placenta. How does this affect my baby? Your baby may:  Be born early.  Not weigh as much as he or she should.  Not handle labor well, leading to a c-section birth. What are the risks?  Having high blood pressure during a past pregnancy.  Being overweight.  Being 41 years old or older.  Being pregnant for the first time.  Being pregnant with more than one baby.  Becoming pregnant using fertility methods, such as IVF.  Having other problems, such as diabetes, or kidney disease.  Having family members who have high blood pressure. What can I do to lower my risk?   Keep a healthy weight.  Eat a healthy diet.  Follow what your doctor tells you about treating any medical problems that you had before becoming pregnant. It is very important to go to all of your doctor visits. Your doctor will check your blood pressure and make sure that your pregnancy is progressing as it should. Treatment should start early if a problem is found. How is this treated? Treatment for high blood pressure during pregnancy can differ depending on the type of high blood pressure you have and how serious it is.  You may need to take blood pressure medicine.  If you have been taking medicine for your blood  pressure, you may need to change the medicine during pregnancy if it is not safe for your baby.  If your doctor thinks that you could get very high blood pressure, he or she may tell you to take a low-dose aspirin during your pregnancy.  If you have very high blood pressure, you may need to stay in the hospital so you and your baby can be watched closely. You may also need to take medicine to lower your blood pressure. This medicine may be given by mouth or through an IV tube.  In some cases, if your condition gets worse, you may need to have your baby early. Follow these instructions at home: Eating and drinking   Drink enough fluid to keep your pee (urine) pale yellow.  Avoid caffeine. Lifestyle  Do not use any products that contain nicotine or tobacco, such as cigarettes, e-cigarettes, and chewing tobacco. If you need help quitting, ask your doctor.  Do not use alcohol or drugs.  Avoid stress.  Rest and get plenty of sleep.  Regular exercise can help. Ask your doctor what kinds of exercise are best for you. General instructions  Take over-the-counter and prescription medicines only as told by your doctor.  Keep all prenatal and follow-up visits as told by your doctor. This is important. Contact a doctor if:  You have symptoms that your doctor told you to watch for, such as: ? Headaches. ? Nausea. ? Vomiting. ? Belly (abdominal) pain. ? Dizziness. ? Light-headedness. Get help right away if:  You have: ? Very bad belly pain that does not get better with treatment. ? A very bad headache that does not get better. ? Vomiting that does not get better. ? Sudden, fast weight gain. ? Sudden swelling in your hands, ankles, or face. ? Bleeding from your vagina. ? Blood in your pee. ? Blurry vision. ? Double vision. ? Shortness of breath. ? Chest pain. ? Weakness on one side of your body. ? Trouble talking.  Your baby is not moving as much as usual. Summary  High  blood pressure is also called hypertension.  High blood pressure  means that the force of your blood moving in your body is too strong.  High blood pressure can cause problems for you and your baby.  Keep all follow-up visits as told by your doctor. This is important. This information is not intended to replace advice given to you by your health care provider. Make sure you discuss any questions you have with your health care provider. Document Revised: 12/18/2018 Document Reviewed: 09/23/2018 Elsevier Patient Education  [place].

## 2020-03-18 ENCOUNTER — Other Ambulatory Visit: Payer: Self-pay

## 2020-03-18 ENCOUNTER — Ambulatory Visit (INDEPENDENT_AMBULATORY_CARE_PROVIDER_SITE_OTHER): Payer: Medicaid Other | Admitting: *Deleted

## 2020-03-18 VITALS — BP 121/83 | HR 73 | Ht 64.0 in | Wt 220.3 lb

## 2020-03-18 DIAGNOSIS — Z4889 Encounter for other specified surgical aftercare: Secondary | ICD-10-CM

## 2020-03-18 MED ORDER — IBUPROFEN 800 MG PO TABS: 800.0000 mg | ORAL_TABLET | Freq: Four times a day (QID) | ORAL | 0 refills | Status: DC

## 2020-03-18 NOTE — Progress Notes (Signed)
Pt reports feeling a bump above her C/S incision on the Rt side. Dr. Ilda Basset in to examine pt. Honeycomb dressing was removed. Incision was cleaned with alcohol and found to be well healed and without redness or  drainage. 4x4 gauze was placed @ incision.  A small firm area of swelling above Rt side of incision was palpated by Dr. Ilda Basset and was not felt to be of concern. Pt advised to shower once daily and keep incision site dry. She was provided with several packs of 4x4 gauze for this purpose. Pt requests refill of ibuprofen which was e-prescribed. She had no further questions and will keep PP appt as scheduled on 8/2.

## 2020-03-20 NOTE — Progress Notes (Signed)
Patient was assessed and managed by nursing staff during this encounter. I have reviewed the chart and agree with the documentation and plan. I have also made any necessary editorial changes.  Aletha Halim, MD 03/20/2020 9:01 PM

## 2020-04-11 ENCOUNTER — Other Ambulatory Visit: Payer: Self-pay

## 2020-04-11 ENCOUNTER — Encounter: Payer: Self-pay | Admitting: Medical

## 2020-04-11 ENCOUNTER — Ambulatory Visit (INDEPENDENT_AMBULATORY_CARE_PROVIDER_SITE_OTHER): Payer: Medicaid Other | Admitting: Medical

## 2020-04-11 DIAGNOSIS — Z8759 Personal history of other complications of pregnancy, childbirth and the puerperium: Secondary | ICD-10-CM | POA: Insufficient documentation

## 2020-04-11 MED ORDER — AMLODIPINE BESYLATE 5 MG PO TABS: 5.0000 mg | ORAL_TABLET | Freq: Every day | ORAL | 1 refills | Status: DC

## 2020-04-11 NOTE — Patient Instructions (Signed)

## 2020-04-11 NOTE — Progress Notes (Signed)
    [facility] Partum Visit Note  Lynn Duncan is a 34 y.o. [id number] female who presents for a postpartum visit. She is 5 weeks postpartum following a primary cesarean section.  I have fully reviewed the prenatal and intrapartum course. The delivery was at 40.1 gestational weeks.  Anesthesia: epidural. Postpartum course has been GHTN, on Norvasc 5 mg. Baby is doing well. Baby is feeding by bottle - [name] Start Gentle. Bleeding red. Bowel function is normal. Bladder function is normal. Patient is not sexually active. Contraception method is IUD, placed post placental. Postpartum depression screening: negative.   The following portions of the patient's history were reviewed and updated as appropriate: allergies, current medications, past family history, past medical history, past social history, past surgical history and problem list.  Review of Systems Pertinent items are noted in HPI.    Objective:  Blood pressure 117/66, pulse 79, unknown if currently breastfeeding.  General:  alert and cooperative   Breasts:  not performed  Lungs: clear to auscultation bilaterally  Heart:  regular rate and rhythm, S1, S2 normal, no murmur, click, rub or gallop  Abdomen: soft, nontender   Vulva:  not evaluated  Vagina: not evaluated  Cervix:  not evaluated  Corpus: not examined  Adnexa:  not evaluated  Rectal Exam: Not performed.        Assessment:   Routine postpartum exam.  History of Gestational Hypertension  S/P Cesarean section   Plan:   Essential components of care per ACOG recommendations:  1.  Mood and well being: Patient with negative depression screening today. Reviewed local resources for support.  - Patient does use tobacco. If using tobacco we discussed reduction and for recently cessation risk of relapse - hx of drug use? No    2. Infant care and feeding:  -Patient currently breastmilk feeding? No  -Social determinants of health (SDOH) reviewed in EPIC. No concerns  3.  Sexuality, contraception and birth spacing - Patient does not want a pregnancy in the next year.  Desired family size is 3 children.  - Reviewed forms of contraception in tiered fashion. Patient had IUD placed IP. - Discussed birth spacing of 18 months  4. Sleep and fatigue -Encouraged family/partner/community support of 4 hrs of uninterrupted sleep to help with mood and fatigue  5. Physical Recovery  - Discussed patients delivery and complications - Patient has urinary incontinence? No  - Patient is not safe to resume physical and sexual activity. Encouraged to wait a minimum of 8 weeks post C/S.   6.  Health Maintenance - Pap smear needed, patient would like to defer to a later date due to new onset bleeding  - Will schedule to return for pap smear and string check in 2 months   7. History of Gestational Hypertension  - Patient has been taking 5 mg Norvasc from significant other even though she was prescribed 2.5 mg at discharge - Patient states BP was still elevated on 2.5 mg but has been well-controlled on 5mg . Rx for 5mg  Norvasc sent to patient's pharmacy - BP check in 2 weeks, S/S of hypotension discussed    Kerry Hough, PA-C [facility], [place]

## 2020-04-25 ENCOUNTER — Telehealth: Payer: Medicaid Other | Admitting: *Deleted

## 2020-04-25 ENCOUNTER — Other Ambulatory Visit: Payer: Self-pay

## 2020-04-25 VITALS — BP 135/99 | HR 82

## 2020-04-25 DIAGNOSIS — Z0130 Encounter for examination of blood pressure without abnormal findings: Secondary | ICD-10-CM

## 2020-04-25 NOTE — Progress Notes (Signed)
I connected with  Jeanie Cooks on 04/25/20 at 1:30 pm EDT by telephone and verified that I am speaking with the correct person using two identifiers.   I discussed the limitations, risks, security and privacy concerns of performing an evaluation and management service by telephone and the availability of in person appointments. I also discussed with the patient that there may be a patient responsible charge related to this service. The patient expressed understanding and agreed to proceed.  Called pt for BP check as scheduled. Initial BP - 125/74 however pt did not have cuff placed properly on her arm. Cuff was repositioned with assistance from significant other in the room. Next reading - 135/99.  Pt stated she has not taken Amlodipine dose yet today. She was observed to be physically moving during most of the video visit and just before 2nd BP reading. She states that she has a slight H/A @ present and has not taken Tylenol or Ibuprofen. She denies visual disturbances currently however reports that she had blurry vision several days ago. Pt also reports that she has a tooth which needs to be pulled and as such has been prescribed Amoxicillin and ibuprofen by her dentist. I advised pt that I will call back in 30 minutes for BP re-check and requested that she be sitting still for at least 10 minutes prior to the call. She voiced understanding and agreed.   [facility] pt for BP re-check - 121/71, P - 78. Pt was advised to take Amlodipine as directed and to continue checking BP daily. She should contact our office or go to [facility] if she has severe H/A or persistent visual disturbances. Pt has next office appt on 10/1. She voiced understanding of all information and instructions given.   Leita Lindbloom, Ronnell Freshwater, RN 04/25/2020  1:45 PM

## 2020-04-27 NOTE — Progress Notes (Signed)
I have reviewed this chart and agree with the RN/CMA assessment and management.    K. Meryl Husain Costabile, M.D. Attending [facility]   

## 2020-05-09 ENCOUNTER — Ambulatory Visit (INDEPENDENT_AMBULATORY_CARE_PROVIDER_SITE_OTHER): Payer: Medicaid Other | Admitting: Family Medicine

## 2020-05-09 ENCOUNTER — Other Ambulatory Visit: Payer: Self-pay

## 2020-05-09 ENCOUNTER — Other Ambulatory Visit (HOSPITAL_COMMUNITY)
Admission: RE | Admit: 2020-05-09 | Discharge: 2020-05-09 | Disposition: A | Discharge status: Home / Self Care | Payer: Medicaid Other | Source: Ambulatory Visit | Attending: Family Medicine | Admitting: Family Medicine

## 2020-05-09 ENCOUNTER — Encounter: Payer: Self-pay | Admitting: Family Medicine

## 2020-05-09 VITALS — BP 139/77 | Ht 65.0 in | Wt 241.0 lb

## 2020-05-09 DIAGNOSIS — R102 Pelvic and perineal pain: Secondary | ICD-10-CM

## 2020-05-09 DIAGNOSIS — Z30431 Encounter for routine checking of intrauterine contraceptive device: Secondary | ICD-10-CM

## 2020-05-09 MED ORDER — MEGESTROL ACETATE 40 MG PO TABS
40.0000 mg | ORAL_TABLET | Freq: Every day | ORAL | 5 refills | Status: AC
Start: 2020-05-09 — End: ?

## 2020-05-09 MED ORDER — MELOXICAM 7.5 MG PO TABS
15.0000 mg | ORAL_TABLET | Freq: Two times a day (BID) | ORAL | 2 refills | Status: AC | PRN
Start: 2020-05-09 — End: ?

## 2020-05-09 NOTE — Progress Notes (Signed)
   Subjective:   Patient Name: Lynn Duncan, female   DOB: 1985-10-22, 34 y.o.  MRN: [id number]  HPI Patient here for an IUD check.  She had a postplacental Liletta IUD placed.  She reports cramping, bleeding, IUD strings hanging out of vagina.   Review of Systems  Constitutional: Negative for fever and chills.  Gastrointestinal: Negative for abdominal pain.  Genitourinary: Negative for vaginal discharge, vaginal pain, pelvic pain and dyspareunia.        Objective:   Physical Exam  Constitutional: She appears well-developed and well-nourished.  HENT:  Head: Normocephalic and atraumatic.  Abdominal: Soft. There is no tenderness. There is no guarding.  Genitourinary: There is no rash, tenderness or lesion on the right labia. There is no rash, tenderness or lesion on the left labia. No erythema or tenderness in the vagina. No foreign body around the vagina. No signs of injury around the vagina. No vaginal discharge found.    Skin: Skin is warm and dry.  Psychiatric: She has a normal mood and affect. Her behavior is normal. Judgment and thought content normal.       Assessment & Plan:  1. IUD check up IUD in place. Strings trimmed  2. Pelvic pain Mobic for pain. Add megace for bleeding. Check [place]. - US PELVIS TRANSVAGINAL NON-OB (TV ONLY); Future

## 2020-05-11 LAB — CERVICOVAGINAL ANCILLARY ONLY
Bacterial Vaginitis (gardnerella): POSITIVE — AB
Candida Glabrata: NEGATIVE
Candida Vaginitis: NEGATIVE
Chlamydia: NEGATIVE
Comment: NEGATIVE
Comment: NEGATIVE
Comment: NEGATIVE
Comment: NEGATIVE
Comment: NEGATIVE
Comment: NORMAL
Neisseria Gonorrhea: NEGATIVE
Trichomonas: NEGATIVE

## 2020-05-12 MED ORDER — METRONIDAZOLE 500 MG PO TABS: 500.0000 mg | ORAL_TABLET | Freq: Two times a day (BID) | ORAL | 0 refills | Status: DC

## 2020-05-12 NOTE — Addendum Note (Signed)
Addended by: Truett Mainland on: 05/12/2020 02:35 PM   Modules accepted: Orders

## 2020-05-17 ENCOUNTER — Ambulatory Visit: Payer: Medicaid Other

## 2020-05-25 ENCOUNTER — Ambulatory Visit
Admission: RE | Admit: 2020-05-25 | Discharge: 2020-05-25 | Disposition: A | Discharge status: Home / Self Care | Payer: Medicaid Other | Source: Ambulatory Visit | Attending: Family Medicine | Admitting: Family Medicine

## 2020-05-25 ENCOUNTER — Other Ambulatory Visit: Payer: Self-pay

## 2020-05-25 DIAGNOSIS — R102 Pelvic and perineal pain: Secondary | ICD-10-CM | POA: Insufficient documentation

## 2020-06-05 ENCOUNTER — Other Ambulatory Visit: Payer: Self-pay | Admitting: Medical

## 2020-06-05 DIAGNOSIS — Z8759 Personal history of other complications of pregnancy, childbirth and the puerperium: Secondary | ICD-10-CM

## 2020-06-10 ENCOUNTER — Other Ambulatory Visit (HOSPITAL_COMMUNITY)
Admission: RE | Admit: 2020-06-10 | Discharge: 2020-06-10 | Disposition: A | Discharge status: Home / Self Care | Payer: Medicaid Other | Source: Ambulatory Visit | Attending: Medical | Admitting: Medical

## 2020-06-10 ENCOUNTER — Encounter: Payer: Self-pay | Admitting: Family Medicine

## 2020-06-10 ENCOUNTER — Other Ambulatory Visit: Payer: Self-pay

## 2020-06-10 ENCOUNTER — Ambulatory Visit (INDEPENDENT_AMBULATORY_CARE_PROVIDER_SITE_OTHER): Payer: Medicaid Other | Admitting: Medical

## 2020-06-10 ENCOUNTER — Encounter: Payer: Self-pay | Admitting: Medical

## 2020-06-10 VITALS — BP 119/83 | HR 76 | Ht 65.0 in | Wt 246.3 lb

## 2020-06-10 DIAGNOSIS — Z01419 Encounter for gynecological examination (general) (routine) without abnormal findings: Secondary | ICD-10-CM | POA: Diagnosis not present

## 2020-06-10 DIAGNOSIS — D251 Intramural leiomyoma of uterus: Secondary | ICD-10-CM

## 2020-06-10 DIAGNOSIS — Z30431 Encounter for routine checking of intrauterine contraceptive device: Secondary | ICD-10-CM

## 2020-06-10 NOTE — Patient Instructions (Signed)

## 2020-06-10 NOTE — Progress Notes (Signed)
History:  [name] is a 34 y.o. G1P1001 who presents to clinic today for pap smear and IUD check. The patient had IUP placed PP after C/S. She returned to have strings trimmed and to discuss continued bleeding 2-3 weeks ago. Strings were trimmed to the level of the cervix and [place] scheduled. Patient was started on Megace for bleeding and had Mobic added for pain. She has been taking both as directed in addition to Tylenol and Ibuprofen. US showed IUD is in place. Fundal fibroid is measuring 4 cm on 05/26/20. She states continued daily pain, rated at 6/10 now since IUD insertion. She does desire future pregnancy. She states bleeding today is lightest amount since IUD inserted.   The following portions of the patient's history were reviewed and updated as appropriate: allergies, current medications, family history, past medical history, social history, past surgical history and problem list.  Review of Systems:  Review of Systems  Constitutional: Negative for chills and fever.  Gastrointestinal: Positive for abdominal pain.  Genitourinary: Negative for dysuria, frequency and urgency.       + spotting Neg discharge      Objective:  Physical Exam BP 119/83 (BP Location: Left Arm)   Pulse 76   Ht 5\' 5"  (1.651 m)   Wt 246 lb 4.8 oz (111.7 kg)   BMI 40.99 kg/m  Physical Exam Vitals and nursing note reviewed. Exam conducted with a chaperone present.  Constitutional:      General: She is not in acute distress.    Appearance: She is well-developed. She is obese.  HENT:     Head: Normocephalic and atraumatic.  Cardiovascular:     Rate and Rhythm: Normal rate.  Pulmonary:     Effort: Pulmonary effort is normal.  Abdominal:     General: There is no distension.     Palpations: Abdomen is soft. There is no mass.     Tenderness: There is abdominal tenderness (mild tenderness to palpation of the suprapubic region). There is no guarding or rebound.  Genitourinary:    General: Normal  vulva.     Vagina: Vaginal discharge (mucous) present. No bleeding (scant).     Cervix: No cervical motion tenderness, discharge or friability.     Uterus: Enlarged. Not tender.      Adnexa:        Right: No mass or tenderness.         Left: No mass or tenderness.    Skin:    General: Skin is warm and dry.     Findings: No erythema.  Neurological:     Mental Status: She is alert and oriented to person, place, and time.  Psychiatric:        Mood and Affect: Mood normal.        Behavior: Behavior normal.     Assessment & Plan:  1. IUD check up - Strings are trimmed and not visualized today - [place] reviewed and IUD in place   2. Well woman exam with routine gynecological exam - Cytology - PAP( [facility])  3. Pelvic pain/Fibroid - Consulted with Dr. Nehemiah Duncan. Recommends removal of IUD even though placement is correct to see if pain improves - Discussed this option with the patient who would like to wait and see if pain improves now that bleeding has mostly stopped - She will send a MyChart message if she is still having issues in the next 2-3 weeks and we will schedule IUD removal - She would consider OCPs  or Depo Provera as alternative MOC  Approximately 25 minutes of total time was spent with this patient on history taking, coordination of care, physical exam, consulting with Attending and documentation.  Lynn Redden, PA-C 06/10/2020 12:09 PM

## 2020-06-13 LAB — CYTOLOGY - PAP
Comment: NEGATIVE
Diagnosis: NEGATIVE
High risk HPV: NEGATIVE

## 2022-03-15 IMAGING — US US MFM OB DETAIL+14 WK
1 series · 13 of 28 positions shown · non-contrast
Comparison: none

[Series 1: us mfm ob detail+14 wk · 104 acquisitions, 13 frames shown]
[im 4/104]
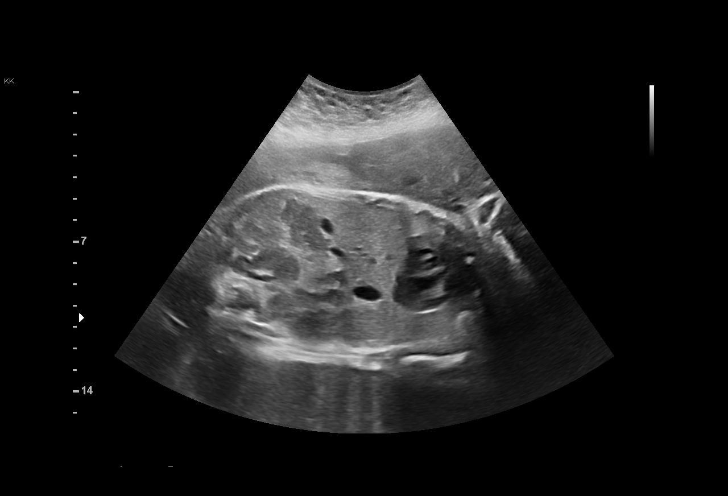
[im 12/104]
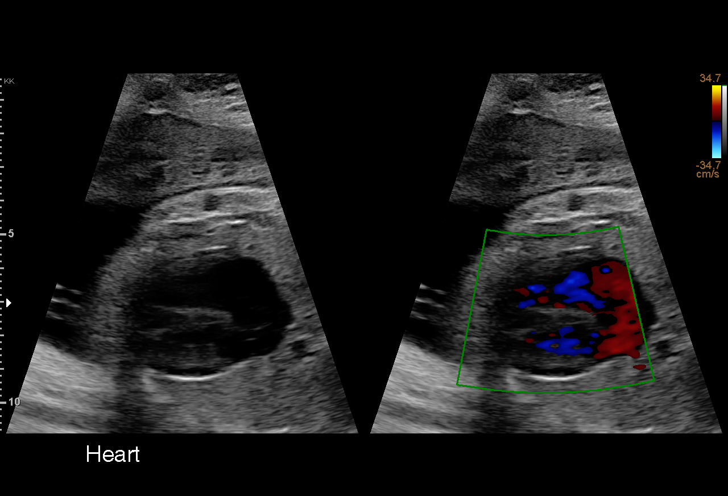
[im 20/104]
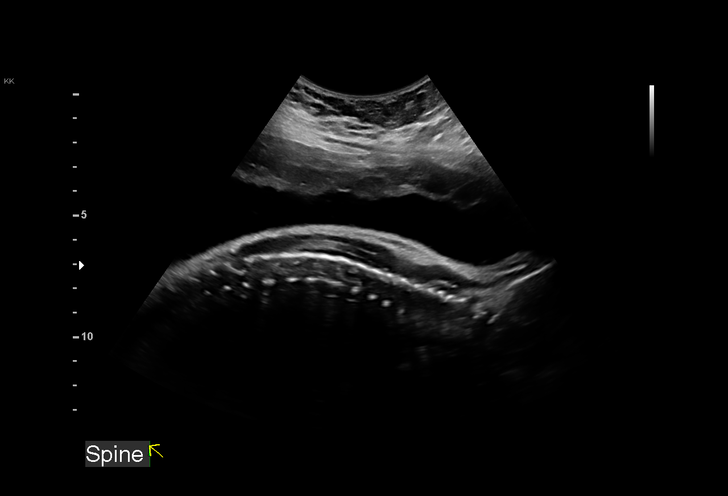
[im 27/104]
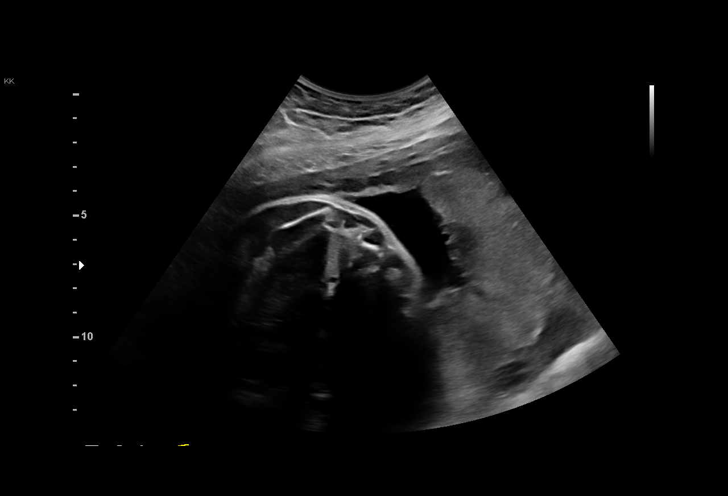
[im 35/104]
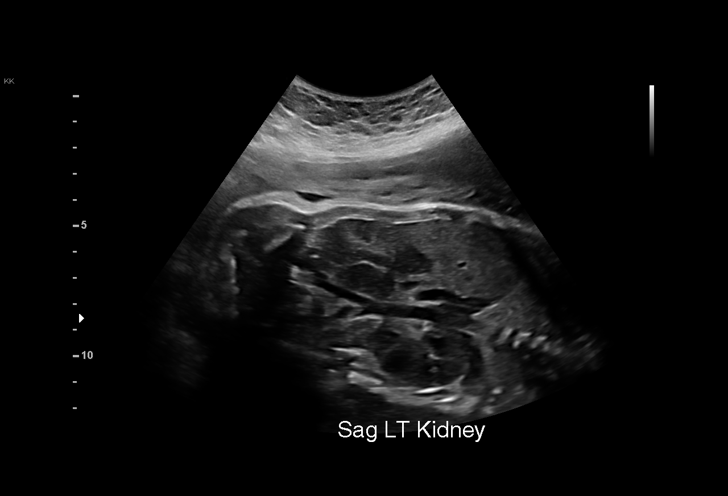
[im 42/104]
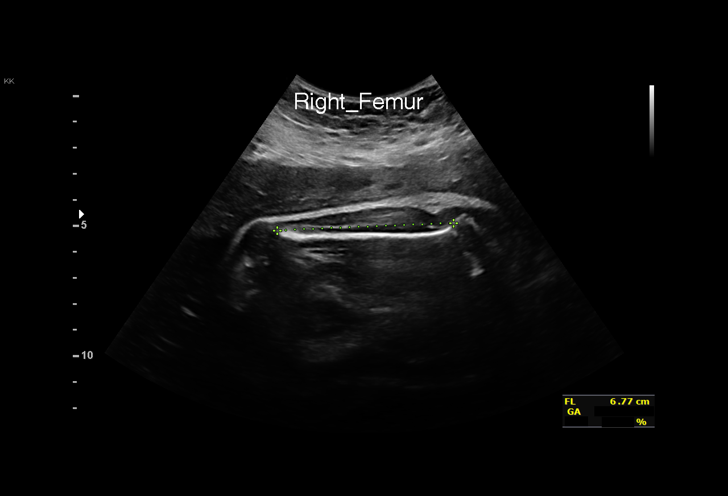
[im 54/104]
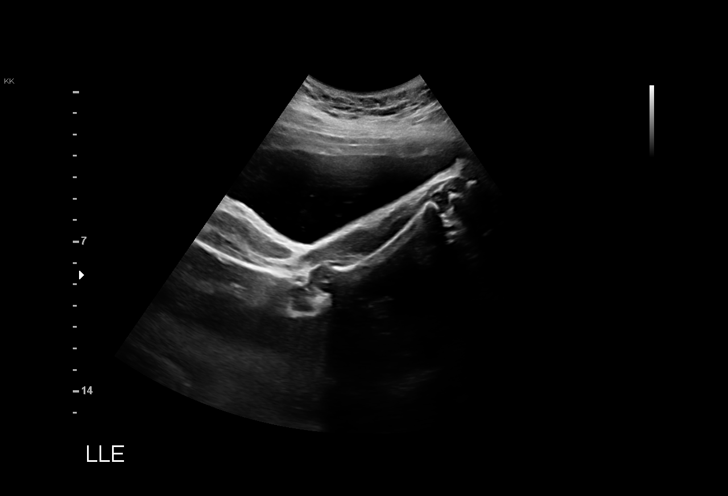
[im 62/104]
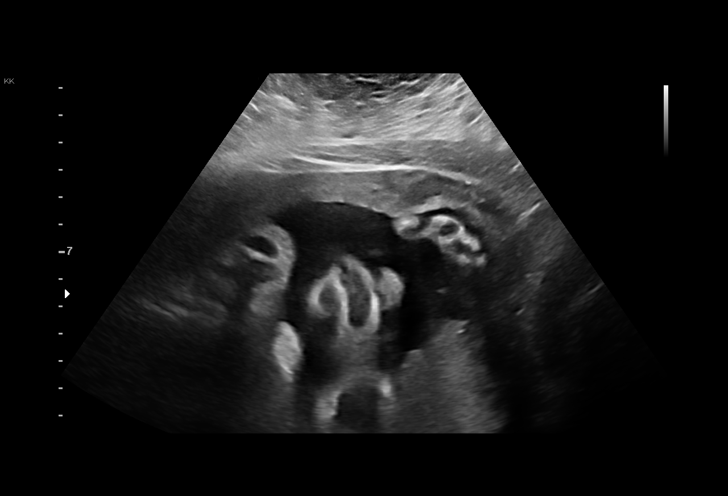
[im 69/104]
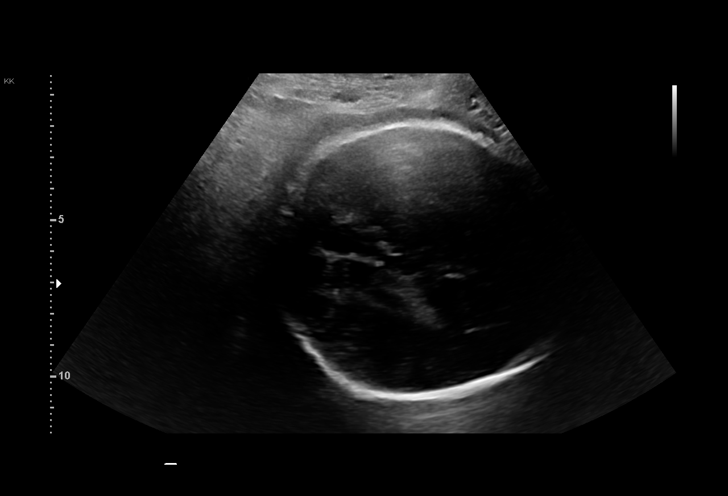
[im 77/104]
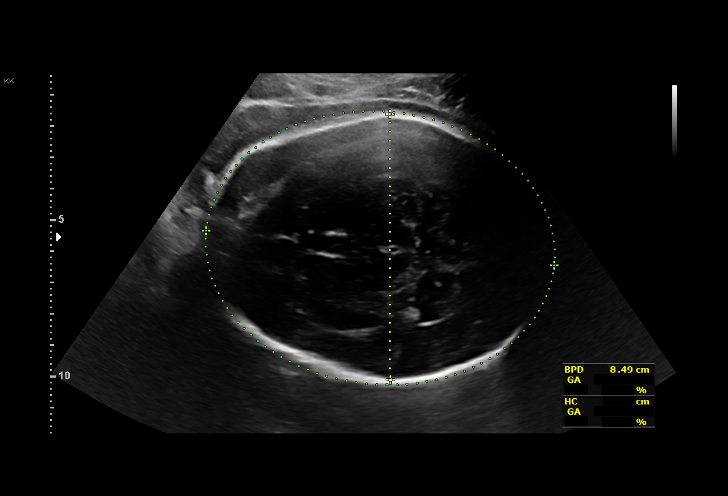
[im 84/104]
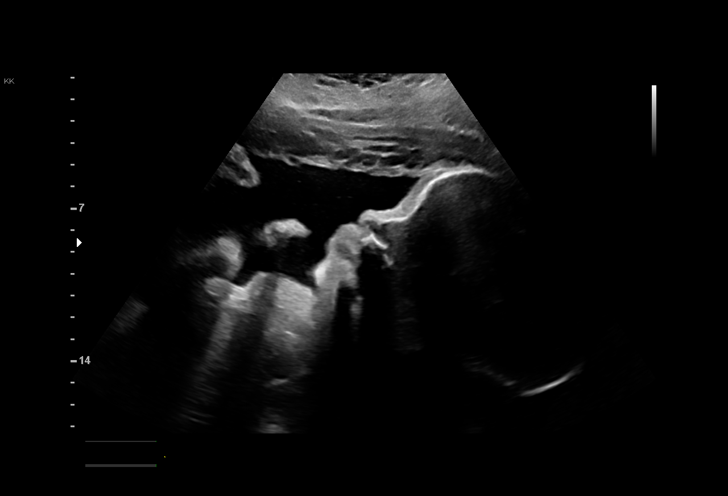
[im 92/104]
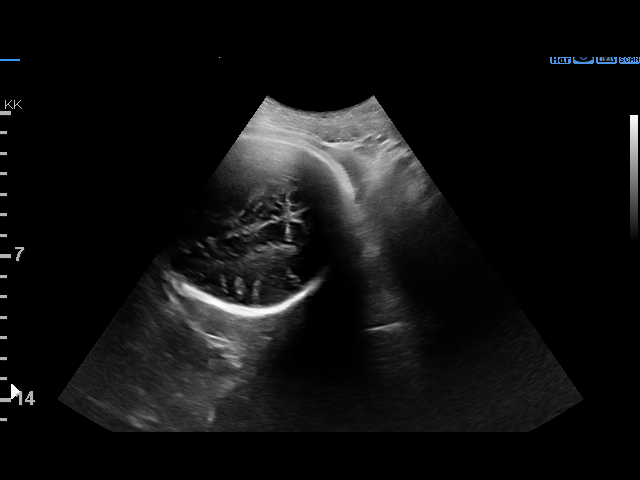
[im 100/104]
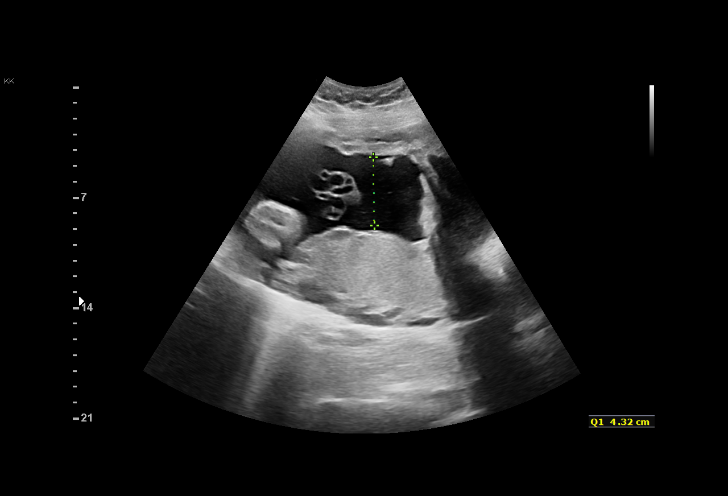

[13 of 28 positions shown; findings below may reference images not displayed]

FALASTIN CNM

Indications

 Insufficient Prenatal Care (started care
 01/14/20)
 Obesity complicating pregnancy, third
 trimester
 Placenta previa specified as without
 hemorrhage, third trimester
 Encounter for antenatal screening for
 malformations
 34 weeks gestation of pregnancy
 Marijuana Use
 Uterine fibroids
Vital Signs

 BMI:
Fetal Evaluation

 Num Of Fetuses:         1
 Fetal Heart Rate(bpm):  130
 Cardiac Activity:       Observed
 Presentation:           Cephalic
 Placenta:               Posterior
 P. Cord Insertion:      Previously Visualized

 Amniotic Fluid
 AFI FV:      Within normal limits

 AFI Sum(cm)     %Tile       Largest Pocket(cm)
 14.3            51
 RUQ(cm)       RLQ(cm)       LUQ(cm)        LLQ(cm)
 4.3           3             4              3
Biometry

 BPD:      84.5  mm     G. Age:  34w 0d         31  %    CI:        74.52   %    70 - 86
                                                         FL/HC:      22.2   %    20.1 -
 HC:      310.7  mm     G. Age:  34w 5d         17  %    HC/AC:      0.97        0.93 -
 AC:      319.9  mm     G. Age:  35w 6d         86  %    FL/BPD:     81.7   %    71 - 87
 FL:         69  mm     G. Age:  35w 3d         61  %    FL/AC:      21.6   %    20 - 24

 Est. FW:    4058  gm    5 lb 15 oz      68  %
OB History

 Gravidity:    1         Term:   0        Prem:   0        SAB:   0
 TOP:          0       Ectopic:  0        Living: 0
Gestational Age

 LMP:           34w 5d        Date:  05/30/19                 EDD:   03/05/20
 U/S Today:     35w 0d                                        EDD:   03/03/20
 Best:          34w 5d     Det. By:  LMP  (05/30/19)          EDD:   03/05/20
Anatomy

 Cranium:               Appears normal         Aortic Arch:            Not well visualized
 Cavum:                 Appears normal         Ductal Arch:            Not well visualized
 Ventricles:            Appears normal         Diaphragm:              Appears normal
 Choroid Plexus:        Appears normal         Stomach:                Appears normal, left
                                                                       sided
 Cerebellum:            Appears normal         Abdomen:                Appears normal
 Posterior Fossa:       Not well visualized    Abdominal Wall:         Not well visualized
 Nuchal Fold:           Not applicable (>20    Cord Vessels:           Appears normal (3
                        wks GA)                                        vessel cord)
 Face:                  Appears normal         Kidneys:                Appear normal
                        (orbits and profile)
 Lips:                  Not well visualized    Bladder:                Appears normal
 Thoracic:              Appears normal         Spine:                  Appears normal
 Heart:                 Not well visualized    Upper Extremities:      Not well visualized
 RVOT:                  Not well visualized    Lower Extremities:      Appears normal
 LVOT:                  Not well visualized

 Other:  Fetus appears to be female.  Technically difficult due to advanced GA
         and fetal position. Technically difficult due to maternal habitus.
Cervix Uterus Adnexa

 Cervix
 Not visualized (advanced GA >33wks)
Impression

 Patient returned for fetal growth and placental location
 assessments. On previous ultrasound, low-lying placenta was
 diagnosed. She does not give history of vaginal bleeding.
 Fetal growth is appropriate for gestational age. Amniotic fluid
 is normal and good fetal activity is seen. Cephalic
 presentation. Placenta is posterior and not low lying.
 We reassured the patient of the findings.
Recommendations

 Follow-up scans as clinically indicated.
                 Jumper, Shayla

## 2022-07-02 ENCOUNTER — Emergency Department (HOSPITAL_COMMUNITY): Payer: Self-pay

## 2022-07-02 ENCOUNTER — Encounter (HOSPITAL_COMMUNITY): Payer: Self-pay

## 2022-07-02 ENCOUNTER — Emergency Department (HOSPITAL_COMMUNITY)
Admission: EM | Admit: 2022-07-02 | Discharge: 2022-07-02 | Disposition: A | Discharge status: Home / Self Care | Payer: Self-pay | Attending: Emergency Medicine | Admitting: Emergency Medicine

## 2022-07-02 ENCOUNTER — Other Ambulatory Visit: Payer: Self-pay

## 2022-07-02 DIAGNOSIS — D72829 Elevated white blood cell count, unspecified: Secondary | ICD-10-CM | POA: Insufficient documentation

## 2022-07-02 DIAGNOSIS — Z20822 Contact with and (suspected) exposure to covid-19: Secondary | ICD-10-CM | POA: Insufficient documentation

## 2022-07-02 DIAGNOSIS — E860 Dehydration: Secondary | ICD-10-CM | POA: Insufficient documentation

## 2022-07-02 DIAGNOSIS — R059 Cough, unspecified: Secondary | ICD-10-CM | POA: Insufficient documentation

## 2022-07-02 DIAGNOSIS — R110 Nausea: Secondary | ICD-10-CM | POA: Insufficient documentation

## 2022-07-02 DIAGNOSIS — R079 Chest pain, unspecified: Secondary | ICD-10-CM | POA: Insufficient documentation

## 2022-07-02 LAB — BASIC METABOLIC PANEL
Anion gap: 9 (ref 5–15)
BUN: 6 mg/dL (ref 6–20)
CO2: 20 mmol/L — ABNORMAL LOW (ref 22–32)
Calcium: 9 mg/dL (ref 8.9–10.3)
Chloride: 106 mmol/L (ref 98–111)
Creatinine, Ser: 0.76 mg/dL (ref 0.44–1.00)
GFR, Estimated: >60 mL/min (ref >60)
Glucose, Bld: 98 mg/dL (ref 70–99)
Potassium: 3.8 mmol/L (ref 3.5–5.1)
Sodium: 135 mmol/L (ref 135–145)

## 2022-07-02 LAB — RESP PANEL BY RT-PCR (FLU A&B, COVID) ARPGX2
Influenza A by PCR: NEGATIVE
Influenza B by PCR: NEGATIVE
SARS Coronavirus 2 by RT PCR: NEGATIVE

## 2022-07-02 LAB — CBC
HCT: 44.7 % (ref 36.0–46.0)
Hemoglobin: 15.3 g/dL — ABNORMAL HIGH (ref 12.0–15.0)
MCH: 28.4 pg (ref 26.0–34.0)
MCHC: 34.2 g/dL (ref 30.0–36.0)
MCV: 83.1 fL (ref 80.0–100.0)
Platelets: 346 10*3/uL (ref 150–400)
RBC: 5.38 MIL/uL — ABNORMAL HIGH (ref 3.87–5.11)
RDW: 13.7 % (ref 11.5–15.5)
WBC: 12.7 10*3/uL — ABNORMAL HIGH (ref 4.0–10.5)
nRBC: 0 % (ref 0.0–0.2)

## 2022-07-02 LAB — I-STAT BETA HCG BLOOD, ED (MC, WL, AP ONLY): I-stat hCG, quantitative: <5 m[IU]/mL (ref <5)

## 2022-07-02 LAB — TROPONIN I (HIGH SENSITIVITY)
Troponin I (High Sensitivity): 6 ng/L (ref <18)
Troponin I (High Sensitivity): 6 ng/L (ref <18)

## 2022-07-02 MED ORDER — IOHEXOL 350 MG/ML SOLN
85.0000 mL | Freq: Once | INTRAVENOUS | Status: AC | PRN
  Administered 2022-07-02: 85 mL via INTRAVENOUS

## 2022-07-02 MED ORDER — ACETAMINOPHEN 500 MG PO TABS
1000.0000 mg | ORAL_TABLET | Freq: Once | ORAL | Status: AC
  Administered 2022-07-02: 1000 mg via ORAL
  Filled 2022-07-02: qty 2

## 2022-07-02 NOTE — ED Provider Notes (Signed)
[facility] EMERGENCY DEPARTMENT Provider Note   CSN: [id number] Arrival date & time: 07/02/22  1123     History  Chief Complaint  Patient presents with   Chest Pain    Lynn Duncan is a 36 y.o. female.  Lynn Duncan is a 36 year old female with a history of uterine fibroids who presents to the emergency department for chest pain.  She states that her chest pain started around 9:00 this morning and has been constant since then.  She describes as a "stretching sensation "in the center of her chest that does not radiate elsewhere.  She states it feels almost like her heart is going to pop out of her chest.  She states that this is worse with inspiration.  She has also had some lightheadedness, but no syncope.  She reports decreased food intake over the last 4 days subsequent to a cocaine binge.  She states that she last used cocaine last night.  She reports that she has had chest pain like this in the past, but it has not lasted this long.  She reports nausea and dry heaving, no emesis.  She reports a prior DVT in 2018, is not on anticoagulation.  No prior PEs, leg pain or swelling today, recent surgery, or hemoptysis today though she thinks she may have had some hemoptysis 4 days ago.  She reports that she has had 2 heart attacks in the past, last one was in April last year.  She denies having stents placed.  She reports that she had a fever last night to 102, has a little bit of a cough, and has a coworker who she believes is sick with [place].   Chest Pain      Home Medications Prior to Admission medications   Medication Sig Start Date End Date Taking? Authorizing Provider  amLODipine (NORVASC) 5 MG tablet TAKE 1 TABLET(5 MG) BY MOUTH DAILY 12/02/20   Luvenia Redden, PA-C  megestrol (MEGACE) 40 MG tablet Take 1 tablet (40 mg total) by mouth daily. Can increase to two tablets twice a day in the event of heavy bleeding 05/09/20   Truett Mainland, DO  meloxicam  (MOBIC) 7.5 MG tablet Take 2 tablets (15 mg total) by mouth 2 (two) times daily as needed for pain. 05/09/20   Truett Mainland, DO      Allergies    Patient has no known allergies.    Review of Systems   Review of Systems  Cardiovascular:  Positive for chest pain.    Physical Exam Updated Vital Signs BP 104/73 (BP Location: Right Arm)   Pulse 62   Temp 98 F (36.7 C) (Oral)   Resp 17   SpO2 100%  Physical Exam Vitals and nursing note reviewed.  Constitutional:      General: She is not in acute distress.    Appearance: She is not ill-appearing, toxic-appearing or diaphoretic.  HENT:     Head: Normocephalic and atraumatic.  Cardiovascular:     Rate and Rhythm: Normal rate and regular rhythm.     Pulses:          Radial pulses are 2+ on the right side and 2+ on the left side.       Dorsalis pedis pulses are 2+ on the right side and 2+ on the left side.     Heart sounds: Normal heart sounds.  Pulmonary:     Effort: Pulmonary effort is normal.     Breath sounds: No  decreased breath sounds, wheezing, rhonchi or rales.  Chest:     Chest wall: No deformity.  Abdominal:     Palpations: Abdomen is soft.     Tenderness: There is no abdominal tenderness. There is no guarding or rebound.  Musculoskeletal:     Cervical back: Normal range of motion.     Right lower leg: No tenderness. No edema.     Left lower leg: No tenderness. No edema.  Skin:    General: Skin is warm and dry.  Neurological:     General: No focal deficit present.     Mental Status: She is alert and oriented to person, place, and time.     ED Results / Procedures / Treatments   Labs (all labs ordered are listed, but only abnormal results are displayed) Labs Reviewed  BASIC METABOLIC PANEL - Abnormal; Notable for the following components:      Result Value   CO2 20 (*)    All other components within normal limits  CBC - Abnormal; Notable for the following components:   WBC 12.7 (*)    RBC 5.38 (*)     Hemoglobin 15.3 (*)    All other components within normal limits  I-STAT BETA HCG BLOOD, ED ([facility], [facility], [facility] ONLY)  TROPONIN I (HIGH SENSITIVITY)  TROPONIN I (HIGH SENSITIVITY)    EKG EKG Interpretation  Date/Time:  Monday July 02 2022 11:36:06 EDT Ventricular Rate:  67 PR Interval:  130 QRS Duration: 74 QT Interval:  398 QTC Calculation: 420 R Axis:   47 Text Interpretation: Sinus rhythm with marked sinus arrhythmia Otherwise normal ECG No previous ECGs available Confirmed by Gareth Morgan [phone]) on 07/02/2022 4:05:18 PM  Radiology DG Chest 2 View  Result Date: 07/02/2022 CLINICAL DATA:  Chest pain EXAM: CHEST - 2 VIEW COMPARISON:  Chest x-ray dated May 01, 2021 FINDINGS: Cardiac and mediastinal contours are within normal limits for AP technique. Both lungs are clear. The visualized skeletal structures are unremarkable. IMPRESSION: No active cardiopulmonary disease. Electronically Signed   By: Yetta Glassman M.D.   On: 07/02/2022 12:50    Procedures Procedures    Medications Ordered in ED Medications - No data to display  ED Course/ Medical Decision Making/ A&P Clinical Course as of 07/02/22 1655  Mon Jul 02, 2022  1634 EKG sinus rhythm with sinus arrhythmia, no STEMI.  No T wave inversions or ST depressions.  No ischemic changes.  No evidence of high-grade conduction block. [DG]    Clinical Course User Index [DG] Renard Matter, MD                           Medical Decision Making Amount and/or Complexity of Data Reviewed Labs: ordered. Decision-making details documented in ED Course. Radiology: ordered and independent interpretation performed. Decision-making details documented in ED Course. ECG/medicine tests: ordered and independent interpretation performed. Decision-making details documented in ED Course.  Risk OTC drugs. Prescription drug management.   Lynn Duncan is a 36 y.o. female with history of C-section, uterine fibroids who presents  with chest pain for >6 hours. The patient is in no acute distress and is non-toxic appearing. Upon my evaluation patient patient endorses atypical chest pain today.  Patient reports history of MI, however I am unable to find any evidence of this in her chart or on care everywhere.  She states that she did not have stents placed or receive CABG.   Initial Testing:  EKG: Normal  sinus rhythm, with sinus arrhythmia. No ST elevations or depressions.  No T wave inversions or hyperacute T waves.  No evidence of AV block, arrhythmia, or acute ischemia on my independent review.  Initial troponin: 6  CXR: No evidence of acute cardiopulmonary abnormality on my independent review, including no signs of pneumonia, widened mediastinum, pleural effusion, pulmonary edema, or enlarged cardiac silhouette.  Differential Diagnosis:  The EKG I obtained reveals no anatomical ischemia representing STEMI, new-onset arrhythmia, or ischemic equivalent, and troponin negative. Therefore I have a low suspicion for ACS, however given reports of prior MI though I cannot find this in the chart, and cocaine use, will obtain repeat troponin. No concerns for pericardial tamponade on EKG and in light of patient's hemodynamic stability. No pain related to supine or prone positions and given EKG and lack of recent viral illness, low suspicion for pericarditis. Additionally do not suspect myocarditis as troponin negative and patient without fever and overall well appearing. Pain is not described as tearing and does not radiate to back, doubt aortic dissection. Additionally, pulses present bilaterally in upper and lower extremities, and CXR does not show widened mediastinum. History, CXR, and exam do not suggest pulmonary edema/CHF as patient without crackles, significant lower extremity edema, or JVD. As CXR without evidence of infiltrates, patient is afebrile, and has no focal findings on lung auscultation or CXR, I have low suspicion for  pneumonia however will obtain test for COVID/flu given reported recent exposure. CXR also shows no evidence of pneumothorax. No peritonitis or free air on CXR worrisome for perforated abdominal viscous.  Patient is not hypoxic and does not have shortness of breath at this time, however describes pleuritic chest pain and reports a history of prior DVT, and possible hemoptysis several days ago, therefore will obtain CTA to evaluate for possible pulmonary embolism.  Remainder of Workup: Labs demonstrate mild leukocytosis to WBC 12.7, hemoglobin 15.3, red blood cells also elevated to 5.38, suspect may be hemoconcentrated with mild dehydration.  Patient not tachycardic, normal capillary refill on exam, does not appear dry, do not feel that she requires IV fluids at this time. BMP with slightly low bicarbonate of 20, otherwise within normal limits. Serial troponins 6, 6.  COVID/flu negative.  CTA chest with no evidence of pulmonary embolism on my independent review or review by radiology.  ED Course:  Patient given Tylenol for pain in the emergency department.  Workup today reassuring against emergency cause of chest pain. EKG without evidence of acute ischemia, serial troponins negative, CXR and CTA chest without evidence of acute cardiopulmonary abnormality, and labs otherwise unremarkable.    I re-evaluated the patient, who remains hemodynamically stable and overall well appearing.  I feel patient is safe for discharge home at this time.  Outpatient follow-up recommended.  Strict return precautions given.  Patient discharged in stable condition.         Final Clinical Impression(s) / ED Diagnoses Final diagnoses:  None    Rx / DC Orders ED Discharge Orders     None         Renard Matter, MD 07/03/22 [id number]    Gareth Morgan, MD 07/03/22 1043

## 2022-07-02 NOTE — ED Notes (Signed)
Patient transported to CT 

## 2022-07-02 NOTE — ED Triage Notes (Signed)
Pt arrives via [facility] by [facility] for chest pain. Pt states that pain is midsternal and non-radiating, woke her from sleep. Pt also c/o nausea. Pt reports hx of MI due to drug use in the past. Cocaine was last used yesterday per pt.   Pt also states she has hx of seizures, not on antiepileptics for over one year. No reported seizures during that time.
